# Patient Record
Sex: Female | Born: 1995 | Race: Black or African American | Hispanic: No | Marital: Single | Smoking: Never smoker
Health system: Southern US, Community
[De-identification: ages and names within clinical notes are randomized; demographics above are authoritative.]

## PROBLEM LIST (undated history)

## (undated) ENCOUNTER — Inpatient Hospital Stay: Payer: Self-pay

## (undated) DIAGNOSIS — Z789 Other specified health status: Secondary | ICD-10-CM

## (undated) HISTORY — PX: OTHER SURGICAL HISTORY: SHX169

## (undated) HISTORY — DX: Other specified health status: Z78.9

---
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
## 2017-02-18 ENCOUNTER — Ambulatory Visit (INDEPENDENT_AMBULATORY_CARE_PROVIDER_SITE_OTHER): Payer: 59 | Admitting: Family Medicine

## 2017-02-18 DIAGNOSIS — Z3042 Encounter for surveillance of injectable contraceptive: Secondary | ICD-10-CM

## 2017-02-18 MED ORDER — MEDROXYPROGESTERONE ACETATE 150 MG/ML IM SUSP
150.0000 mg | Freq: Once | INTRAMUSCULAR | Status: AC
  Administered 2017-02-18: 150 mg via INTRAMUSCULAR

## 2017-02-18 NOTE — Progress Notes (Signed)
Here for depo-provera injection per CMA.

## 2017-02-18 NOTE — Consult Note (Signed)
  Patient came into office today for Depo Provera injection she states that she feels well today and has no other complaints or concerns to address. Patients last Depo was administered on 11/19/16 she was instructed to return back to clinic on or between the dates of 12/7-12/21/18. Patient tolerated injection well today and showed no signs of adverse reaction, she was instructed to return back to the clinic on or between the dates of 3/8-3/22/19.

## 2017-05-06 ENCOUNTER — Ambulatory Visit: Payer: Self-pay | Admitting: Physician Assistant

## 2017-05-06 ENCOUNTER — Ambulatory Visit (INDEPENDENT_AMBULATORY_CARE_PROVIDER_SITE_OTHER): Payer: 59 | Admitting: Family Medicine

## 2017-05-06 DIAGNOSIS — Z3042 Encounter for surveillance of injectable contraceptive: Secondary | ICD-10-CM | POA: Diagnosis not present

## 2017-05-06 MED ORDER — MEDROXYPROGESTERONE ACETATE 150 MG/ML IM SUSP
150.0000 mg | Freq: Once | INTRAMUSCULAR | Status: AC
  Administered 2017-05-06: 150 mg via INTRAMUSCULAR

## 2017-05-06 NOTE — Progress Notes (Signed)
Patient comes in office today for Depo injection. Patient had her last  depo provera injection on 02/18/2017 and was instructed to return back between the dates of 05/06/17-05/20/17. Patient tolerated injection well today with no adverse reactions. Patient states she has been receiving injections the past 2 years. She reports that she will call back before May 24 th 2019 to schedule a appointment to discuss alternate contraceptive.

## 2020-06-13 DIAGNOSIS — O365990 Maternal care for other known or suspected poor fetal growth, unspecified trimester, not applicable or unspecified: Secondary | ICD-10-CM

## 2020-10-06 ENCOUNTER — Ambulatory Visit: Payer: 59

## 2022-10-17 ENCOUNTER — Emergency Department
Admission: EM | Admit: 2022-10-17 | Discharge: 2022-10-17 | Disposition: A | Discharge status: Home / Self Care | Payer: Managed Care, Other (non HMO) | Attending: Emergency Medicine | Admitting: Emergency Medicine

## 2022-10-17 ENCOUNTER — Encounter: Payer: Self-pay | Admitting: *Deleted

## 2022-10-17 ENCOUNTER — Other Ambulatory Visit: Payer: Self-pay

## 2022-10-17 DIAGNOSIS — K0889 Other specified disorders of teeth and supporting structures: Secondary | ICD-10-CM | POA: Diagnosis present

## 2022-10-17 DIAGNOSIS — K029 Dental caries, unspecified: Secondary | ICD-10-CM | POA: Diagnosis not present

## 2022-10-17 MED ORDER — ONDANSETRON 4 MG PO TBDP
4.0000 mg | ORAL_TABLET | Freq: Once | ORAL | Status: AC
  Administered 2022-10-17: 4 mg via ORAL
  Filled 2022-10-17: qty 1

## 2022-10-17 MED ORDER — BUPIVACAINE HCL (PF) 0.5 % IJ SOLN
50.0000 mL | Freq: Once | INTRAMUSCULAR | Status: AC
  Administered 2022-10-17: 10 mL
  Filled 2022-10-17 (×2): qty 60

## 2022-10-17 MED ORDER — AMOXICILLIN-POT CLAVULANATE 875-125 MG PO TABS
1.0000 | ORAL_TABLET | Freq: Once | ORAL | Status: AC
  Administered 2022-10-17: 1 via ORAL
  Filled 2022-10-17: qty 1

## 2022-10-17 MED ORDER — KETOROLAC TROMETHAMINE 30 MG/ML IJ SOLN
30.0000 mg | Freq: Once | INTRAMUSCULAR | Status: AC
  Administered 2022-10-17: 30 mg via INTRAMUSCULAR
  Filled 2022-10-17: qty 1

## 2022-10-17 MED ORDER — ACETAMINOPHEN 500 MG PO TABS
1000.0000 mg | ORAL_TABLET | Freq: Once | ORAL | Status: AC
  Administered 2022-10-17: 1000 mg via ORAL
  Filled 2022-10-17: qty 2

## 2022-10-17 MED ORDER — BUPIVACAINE HCL 0.5 % IJ SOLN: 50.0000 mL | Freq: Once | INTRAMUSCULAR | Status: DC

## 2022-10-17 MED ORDER — ONDANSETRON HCL 4 MG PO TABS: 4.0000 mg | ORAL_TABLET | Freq: Every day | ORAL | 1 refills | Status: DC | PRN

## 2022-10-17 MED ORDER — AMOXICILLIN-POT CLAVULANATE 875-125 MG PO TABS: 1.0000 | ORAL_TABLET | Freq: Two times a day (BID) | ORAL | 0 refills | Status: AC

## 2022-10-17 MED ORDER — OXYCODONE HCL 5 MG PO TABS: 5.0000 mg | ORAL_TABLET | Freq: Three times a day (TID) | ORAL | 0 refills | Status: DC | PRN

## 2022-10-17 MED ORDER — OXYCODONE HCL 5 MG PO TABS
5.0000 mg | ORAL_TABLET | Freq: Once | ORAL | Status: AC
  Administered 2022-10-17: 5 mg via ORAL
  Filled 2022-10-17: qty 1

## 2022-10-17 NOTE — ED Notes (Addendum)
Patient c/o left lower jaw/tooth pain. Appears to have breakage in tooth near the back of the mouth. Swelling noted to left jaw

## 2022-10-17 NOTE — ED Triage Notes (Signed)
Pt reports right sided dental pain, onset tonight, she took 2 amoxicillin and ibuprofen without relief.

## 2022-10-17 NOTE — ED Provider Notes (Signed)
[facility] Provider Note    Event Date/Time   First MD Initiated Contact with Patient 10/17/22 [phone]     (approximate)   History   Dental Pain   HPI  Jasmine Perkins is a 27 y.o. female   Past medical history of significant past medical history presents emergency department with dental pain.  She has had ongoing right lower molar pain for the past year and was asked to see a dentist but has never connected with 1 due to insurance problems.  Pain is intermittent chronic but worsened over the last several days.  Involves her right side lower jaw.  She has had no significant swelling.  No problems with swallowing or breathing.  No fever or chills.  Independent Historian contributed to assessment above: Her boyfriend is at bedside corroborates information past medical history as above    Physical Exam   Triage Vital Signs: ED Triage Vitals  Encounter Vitals Group     BP 10/17/22 0105 (!) 147/106     Systolic BP Percentile --      Diastolic BP Percentile --      Pulse Rate 10/17/22 0105 67     Resp 10/17/22 0105 20     Temp 10/17/22 0105 98.5 F (36.9 C)     Temp Source 10/17/22 0105 Axillary     SpO2 10/17/22 0105 100 %     Weight 10/17/22 0105 180 lb (81.6 kg)     Height 10/17/22 0105 5\' 7"  (1.702 m)     Head Circumference --      Peak Flow --      Pain Score 10/17/22 0107 10     Pain Loc --      Pain Education --      Exclude from Growth Chart --     Most recent vital signs: Vitals:   10/17/22 0105 10/17/22 0523  BP: (!) 147/106 133/77  Pulse: 67 70  Resp: 20 18  Temp: 98.5 F (36.9 C) 98 F (36.7 C)  SpO2: 100% 100%    General: Awake, no distress.  CV:  Good peripheral perfusion.  Resp:  Normal effort.  Abd:  No distention.  Other:  Phonation is normal.  Nontoxic-appearing afebrile.  No facial or neck swelling.  There appears to be dental caries to the affected right lower molar.  No obvious abscess.   ED Results / Procedures /  Treatments   Labs (all labs ordered are listed, but only abnormal results are displayed) Labs Reviewed - No data to display  PROCEDURES:  Critical Care performed: No  Dental Block  Date/Time: 10/17/2022 6:02 AM  Performed by: Pilar Jarvis, MD Authorized by: Pilar Jarvis, MD   Consent:    Consent obtained:  Verbal   Consent given by:  Patient   Risks discussed:  Allergic reaction, infection, pain, intravascular injection and nerve damage   Alternatives discussed:  No treatment and delayed treatment Universal protocol:    Procedure explained and questions answered to patient or proxy's satisfaction: yes     Patient identity confirmed:  Verbally with patient Indications:    Indications: dental pain   Location:    Block type:  Inferior alveolar   Laterality:  Right Procedure details:    Syringe type:  Controlled syringe   Needle gauge:  25 G   Anesthetic injected:  Bupivacaine 0.5% w/o epi   Injection procedure:  Anatomic landmarks identified, anatomic landmarks palpated, introduced needle, negative aspiration for blood and incremental  injection Post-procedure details:    Outcome:  Anesthesia achieved   Procedure completion:  Tolerated    MEDICATIONS ORDERED IN ED: Medications  ketorolac (TORADOL) 30 MG/ML injection 30 mg (30 mg Intramuscular Given 10/17/22 0442)  acetaminophen (TYLENOL) tablet 1,000 mg (1,000 mg Oral Given 10/17/22 0442)  amoxicillin-clavulanate (AUGMENTIN) 875-125 MG per tablet 1 tablet (1 tablet Oral Given 10/17/22 0441)  ondansetron (ZOFRAN-ODT) disintegrating tablet 4 mg (4 mg Oral Given 10/17/22 0442)  oxyCODONE (Oxy IR/ROXICODONE) immediate release tablet 5 mg (5 mg Oral Given 10/17/22 0442)  bupivacaine(PF) (MARCAINE) 0.5 % injection 50 mL (10 mLs Infiltration Given 10/17/22 0454)     IMPRESSION / MDM / ASSESSMENT AND PLAN / ED COURSE  I reviewed the triage vital signs and the nursing notes.                                Patient's presentation is  most consistent with acute presentation with potential threat to life or bodily function.  Differential diagnosis includes, but is not limited to, dental pain, dental caries, dental infection   The patient is on the cardiac monitor to evaluate for evidence of arrhythmia and/or significant heart rate changes.  MDM:    Patient with dental pain, dental caries, nontoxic doubt sepsis no drainable abscess noted.  Needs to follow-up with dentist, I gave her the contact information for our [facility] dental contact.  Pain treatment in the emergency department with dental block, oral pain medications, anti-inflammatory, and antibiotic.       FINAL CLINICAL IMPRESSION(S) / ED DIAGNOSES   Final diagnoses:  Pain, dental     Rx / DC Orders   ED Discharge Orders          Ordered    amoxicillin-clavulanate (AUGMENTIN) 875-125 MG tablet  2 times daily        10/17/22 0436    oxyCODONE (ROXICODONE) 5 MG immediate release tablet  Every 8 hours PRN        10/17/22 0436    ondansetron (ZOFRAN) 4 MG tablet  Daily PRN        10/17/22 0438             Note:  This document was prepared using Dragon voice recognition software and may include unintentional dictation errors.    Pilar Jarvis, MD 10/17/22 [phone]

## 2022-10-17 NOTE — Discharge Instructions (Addendum)
Take acetaminophen 650 mg and ibuprofen 400 mg every 6 hours for pain.  Take with food. Take oxycodone as prescribed for severe/breakthrough pain as we discussed.  Take Zofran as needed for nausea. Take augmentin antibiotic for 10 days.   Call Dr. Lucky Cowboy for an appointment to address your tooth pain, or find any other dentist as soon as possible.  Thank you for choosing [place] for your health care today!  Please see your primary doctor this week for a follow up appointment.   If you have any new, worsening, or unexpected symptoms call your doctor right away or come back to the emergency department for reevaluation.  It was my pleasure to care for you today.   Daneil Dan Modesto Charon, MD

## 2023-03-02 NOTE — L&D Delivery Note (Signed)
 Vaginal delivery note : Pt in OR for urgent c/s for Fetal intolerance to labor . Prepping the patient and RN taking off FSE stated pt was complete and complete+2 station   Pt then placed in Mcroberts position and after 3 sets of pushes a Kiwi vacuum was placed and second set of pushes she delivered a head . Loose nuchal cord was reduced and shoulders and body delivered without difficulty . Female  cord clamped and cut and nursery staff assigned apgars 8/9 . Weight #4/8 Placenta delivered 5 minutes after  IV pitocin administered . Good hemostasis. No vaginal lacerations . EBL 100 cc. No complications

## 2023-04-12 DIAGNOSIS — O0993 Supervision of high risk pregnancy, unspecified, third trimester: Secondary | ICD-10-CM | POA: Insufficient documentation

## 2023-04-12 DIAGNOSIS — O0992 Supervision of high risk pregnancy, unspecified, second trimester: Secondary | ICD-10-CM | POA: Insufficient documentation

## 2023-04-13 DIAGNOSIS — O3503X0 Maternal care for (suspected) central nervous system malformation or damage in fetus, choroid plexus cysts, not applicable or unspecified: Secondary | ICD-10-CM | POA: Insufficient documentation

## 2023-04-13 LAB — OB RESULTS CONSOLE VARICELLA ZOSTER ANTIBODY, IGG: Varicella: IMMUNE

## 2023-04-13 LAB — OB RESULTS CONSOLE RUBELLA ANTIBODY, IGM: Rubella: IMMUNE

## 2023-04-13 LAB — OB RESULTS CONSOLE HEPATITIS B SURFACE ANTIGEN: Hepatitis B Surface Ag: NEGATIVE

## 2023-05-17 DIAGNOSIS — O365990 Maternal care for other known or suspected poor fetal growth, unspecified trimester, not applicable or unspecified: Secondary | ICD-10-CM | POA: Insufficient documentation

## 2023-06-07 ENCOUNTER — Telehealth: Payer: Self-pay

## 2023-06-07 ENCOUNTER — Other Ambulatory Visit: Payer: Self-pay | Admitting: Obstetrics and Gynecology

## 2023-06-07 DIAGNOSIS — Z36 Encounter for antenatal screening for chromosomal anomalies: Secondary | ICD-10-CM

## 2023-06-07 NOTE — Telephone Encounter (Signed)
REFERRAL

## 2023-06-08 ENCOUNTER — Telehealth: Payer: Self-pay

## 2023-06-14 ENCOUNTER — Emergency Department

## 2023-06-14 ENCOUNTER — Inpatient Hospital Stay: Admission: EM | Admit: 2023-06-14 | Discharge: 2023-06-17 | DRG: 832

## 2023-06-14 DIAGNOSIS — O99891 Other specified diseases and conditions complicating pregnancy: Principal | ICD-10-CM | POA: Diagnosis present

## 2023-06-14 DIAGNOSIS — R6884 Jaw pain: Secondary | ICD-10-CM | POA: Diagnosis not present

## 2023-06-14 DIAGNOSIS — K122 Cellulitis and abscess of mouth: Principal | ICD-10-CM

## 2023-06-14 DIAGNOSIS — Z3A27 27 weeks gestation of pregnancy: Secondary | ICD-10-CM

## 2023-06-14 DIAGNOSIS — O365920 Maternal care for other known or suspected poor fetal growth, second trimester, not applicable or unspecified: Secondary | ICD-10-CM | POA: Diagnosis present

## 2023-06-14 DIAGNOSIS — Z8249 Family history of ischemic heart disease and other diseases of the circulatory system: Secondary | ICD-10-CM

## 2023-06-14 LAB — CBC WITH DIFFERENTIAL/PLATELET
Abs Immature Granulocytes: 0.05 10*3/uL (ref 0.00–0.07)
Basophils Absolute: 0 10*3/uL (ref 0.0–0.1)
Basophils Relative: 0 %
Eosinophils Absolute: 0 10*3/uL (ref 0.0–0.5)
Eosinophils Relative: 0 %
HCT: 37.5 % (ref 36.0–46.0)
Hemoglobin: 12.6 g/dL (ref 12.0–15.0)
Immature Granulocytes: 0 %
Lymphocytes Relative: 10 %
Lymphs Abs: 1.2 10*3/uL (ref 0.7–4.0)
MCH: 29 pg (ref 26.0–34.0)
MCHC: 33.6 g/dL (ref 30.0–36.0)
MCV: 86.4 fL (ref 80.0–100.0)
Monocytes Absolute: 0.9 10*3/uL (ref 0.1–1.0)
Monocytes Relative: 7 %
Neutro Abs: 9.8 10*3/uL — ABNORMAL HIGH (ref 1.7–7.7)
Neutrophils Relative %: 83 %
Platelets: 249 10*3/uL (ref 150–400)
RBC: 4.34 MIL/uL (ref 3.87–5.11)
RDW: 12.9 % (ref 11.5–15.5)
WBC: 12 10*3/uL — ABNORMAL HIGH (ref 4.0–10.5)
nRBC: 0 % (ref 0.0–0.2)

## 2023-06-14 LAB — BASIC METABOLIC PANEL WITH GFR
Anion gap: 10 (ref 5–15)
BUN: 6 mg/dL (ref 6–20)
CO2: 22 mmol/L (ref 22–32)
Calcium: 8.8 mg/dL — ABNORMAL LOW (ref 8.9–10.3)
Chloride: 102 mmol/L (ref 98–111)
Creatinine, Ser: 0.71 mg/dL (ref 0.44–1.00)
GFR, Estimated: >60 mL/min (ref >60)
Glucose, Bld: 97 mg/dL (ref 70–99)
Potassium: 3.8 mmol/L (ref 3.5–5.1)
Sodium: 134 mmol/L — ABNORMAL LOW (ref 135–145)

## 2023-06-14 LAB — GROUP A STREP BY PCR: Group A Strep by PCR: NOT DETECTED

## 2023-06-14 MED ORDER — SODIUM CHLORIDE 0.9 % IV SOLN
3.0000 g | Freq: Once | INTRAVENOUS | Status: AC
  Administered 2023-06-14: 3 g via INTRAVENOUS
  Filled 2023-06-14: qty 8

## 2023-06-14 MED ORDER — DEXAMETHASONE SODIUM PHOSPHATE 10 MG/ML IJ SOLN
10.0000 mg | Freq: Once | INTRAMUSCULAR | Status: AC
  Administered 2023-06-14: 10 mg via INTRAVENOUS
  Filled 2023-06-14: qty 1

## 2023-06-14 MED ORDER — IOHEXOL 300 MG/ML  SOLN
75.0000 mL | Freq: Once | INTRAMUSCULAR | Status: AC | PRN
  Administered 2023-06-14: 75 mL via INTRAVENOUS

## 2023-06-14 NOTE — ED Provider Notes (Signed)
 [facility] Emergency Department Provider Note     Event Date/Time   First MD Initiated Contact with Patient 06/14/23 1840     (approximate)   History   Dental Pain and Sore Throat   HPI  Jasmine Perkins is a 28 y.o. female G1, P0 at 7 months gestation with a single IUP, presents to the ED endorsing pain to the right lower jaw that she believes is due to a wisdom tooth cavity.  She is also endorsing some trismus that she describes difficulty fully opening the jaw.  She would also endorse some difficulty swallowing her secretions with onset yesterday.  Patient denies any frank fevers but is endorsing some chills.  She reports difficulty swallowing liquids and solid food at this time but has had poor p.o. intake since yesterday.  She gives a remote history of strep pharyngitis as an adult.  She denies any pregnancy-related complaints at this time.   Physical Exam   Triage Vital Signs: ED Triage Vitals  Encounter Vitals Group     BP 06/14/23 1747 (!) 134/102     Systolic BP Percentile --      Diastolic BP Percentile --      Pulse Rate 06/14/23 1747 99     Resp 06/14/23 1747 18     Temp 06/14/23 1747 99 F (37.2 C)     Temp src --      SpO2 06/14/23 1747 99 %     Weight 06/14/23 1743 180 lb (81.6 kg)     Height 06/14/23 1743 5\' 7"  (1.702 m)     Head Circumference --      Peak Flow --      Pain Score 06/14/23 1743 10     Pain Loc --      Pain Education --      Exclude from Growth Chart --     Most recent vital signs: Vitals:   06/14/23 2230 06/14/23 2300  BP: 117/73 121/87  Pulse: 85 99  Resp:  14  Temp:    SpO2: 99% 100%    General Awake, no distress. NAD HEENT NCAT. PERRL. EOMI. No rhinorrhea. Mucous membranes are moist.  Uvula is midline but the right peritonsillar space appears full and erythematous.  The right lower jaw shows some mild pericoronitis around the right lower molar.  No focal gum swelling, pointing, or fluctuance is  appreciated.  No sublingual edema noted.  Fullness to the submandibular region slightly to the right of midline noted on exam. CV:  Good peripheral perfusion. RRR RESP:  Normal effort. CTA ABD:  No distention. gravid   ED Results / Procedures / Treatments   Labs (all labs ordered are listed, but only abnormal results are displayed) Labs Reviewed  BASIC METABOLIC PANEL WITH GFR - Abnormal; Notable for the following components:      Result Value   Sodium 134 (*)    Calcium 8.8 (*)    All other components within normal limits  CBC WITH DIFFERENTIAL/PLATELET - Abnormal; Notable for the following components:   WBC 12.0 (*)    Neutro Abs 9.8 (*)    All other components within normal limits  GROUP A STREP BY PCR    EKG   RADIOLOGY  I personally viewed and evaluated these images as part of my medical decision making, as well as reviewing the written report by the radiologist.  ED Provider Interpretation: Submandibular space infection with a edema, cellulitis, and phlegmon.  No  discrete drainable abscess noted  CT Soft Tissue Neck W Contrast Result Date: 06/14/2023 CLINICAL DATA:  Epiglottitis or tonsillitis suspected Peritonsilar abscess + submandibular space fullness EXAM: CT NECK WITH CONTRAST TECHNIQUE: Multidetector CT imaging of the neck was performed using the standard protocol following the bolus administration of intravenous contrast. RADIATION DOSE REDUCTION: This exam was performed according to the departmental dose-optimization program which includes automated exposure control, adjustment of the mA and/or kV according to patient size and/or use of iterative reconstruction technique. CONTRAST:  75mL OMNIPAQUE IOHEXOL 300 MG/ML  SOLN COMPARISON:  None Available. FINDINGS: Pharynx/larynx and surrounding soft tissues: Edema involving the right submandibular space with extension to involve the right floor mouth and right masticator space. No discrete, drainable fluid collection  Salivary glands: Edema surrounding the right submandibular gland but the gland itself appears within normal limits. Normal appearance of the parotid glands. No calculi. No ductal dilation Thyroid: Subcentimeter thyroid nodules do not require further imaging follow-up (ref: J Am Coll Radiol. 2015 Feb;12(2): 143-50). Lymph nodes: Mildly prominent right submandibular lymph nodes. Area of submental nodularity seen on remote CT neck is likely still present and similar. Vascular: Not well evaluated due to non arterial timing. Limited intracranial: Negative. Visualized orbits: Negative. Mastoids and visualized paranasal sinuses: Clear. Skeleton: No acute or aggressive process. Upper chest: Visualized lung apices are clear. IMPRESSION: 1. Edema involving the right submandibular space with extension to involve the right floor mouth and right masticator space, most likely infectious cellulitis/phlegmon. No discrete, drainable fluid collection. 2. Mildly irregular soft tissue nodularity in the submental space is likely similar in comparison to remote 2015 prior. Electronically Signed   By: Stevenson Elbe M.D.   On: 06/14/2023 22:45     PROCEDURES:  Critical Care performed: No  Procedures   MEDICATIONS ORDERED IN ED: Medications  Ampicillin-Sulbactam (UNASYN) 3 g in sodium chloride 0.9 % 100 mL IVPB (0 g Intravenous Stopped 06/14/23 2308)  dexamethasone (DECADRON) injection 10 mg (10 mg Intravenous Given 06/14/23 2055)  iohexol (OMNIPAQUE) 300 MG/ML solution 75 mL (75 mLs Intravenous Contrast Given 06/14/23 2127)     IMPRESSION / MDM / ASSESSMENT AND PLAN / ED COURSE  I reviewed the triage vital signs and the nursing notes.                              Differential diagnosis includes, but is not limited to, dental abscess, dental caries, pericoronitis, Ludwig's angina, peritonsillar abscess, strep pharyngitis  Patient's presentation is most consistent with acute presentation with potential threat to  life or bodily function.  ----------------------------------------- 8:42 PM on 06/14/2023 ----------------------------------------- S/W Dr. Silvestre Drum (ENT): He recommends at least IV Unasyn and Decadron, and consult to OB to see if is any concern with the patient proceeding with a CT scan.  ----------------------------------------- 8:46 PM on 06/14/2023 ----------------------------------------- S/W Fraser Jackson, CNM: Discussed the case including recommendations from ENT.  No concern for the use of Decadron in the third trimester for this patient.  We discussed risk/benefit of CT imaging to determine the severity of the patient's increasing soft tissue neck infection.  She agrees that imaging is appropriate in this situation, and will await imaging results and prognosis.  Female patient at 96 months gestation with an acute soft tissue infection to the throat with clinical picture concerning for an expanding PTA and or submandibular infection.  I discussed the risk-benefit with further imaging versus medical management with the patient.  She understands  the potential for worsening of her condition as well as need for bedside intervention.  We discussed the radiation exposure with CT imaging.  Patient was agreeable to move forward with CT imaging as discussed.  ----------------------------------------- 11:23 PM on 06/14/2023 ----------------------------------------- CT results reviewed with the patient and her husband at bedside.  No drainable abscesses appreciated with the submandibular space infection.  Recommendation is that she be admitted to the hospital for continued IV antibiotics and reevaluation tomorrow.  Patient is understanding of the diagnosis, prognosis, and recommendation.  She agrees to admission at this time.  Dr. Vallarie Gauze returned the page regarding hospital admission.  She advises that the [facility] team will admit and she is available for consult for medical management.  Patient's diagnosis is  consistent with an acute submandibular space infection with no appreciable drainable abscess. Patient will be admitted to the [facility] service for ongoing IV antibiotic admission.  FINAL CLINICAL IMPRESSION(S) / ED DIAGNOSES   Final diagnoses:  Submandibular space infection  Pregnancy with 27 completed weeks gestation     Rx / DC Orders   ED Discharge Orders     None        Note:  This document was prepared using Dragon voice recognition software and may include unintentional dictation errors.    May Sparks, PA-C 06/14/23 2327    Shane Darling, MD 06/15/23 [phone]

## 2023-06-14 NOTE — H&P (Incomplete)
 HISTORY AND PHYSICAL NOTE  History of Present Illness: Jasmine Perkins is a 28 y.o. G1P0 at [redacted]w[redacted]d admitted for submandibular space infection.  She initially presented to the ED tonight with complaints of right lower jaw pain that she thought was due to wisdom tooth cavity.  She reported difficulty fully opening her jaw and difficulty with swallowing.  She denies fevers but does report chills. Reports difficulty with swallowing liquids and solid food and overall poor PO intake since yesterday. Denies any pregnancy related concerns. Her current pregnancy is complicated by fetal growth restriction that is currently followed by Maternal Fetal Medicine.   Reports active fetal movement  Contractions: denies  LOF/SROM: denies  Vaginal bleeding: denies    Prenatal care site:  [facility]  Patient Active Problem List   Diagnosis Date Noted  . 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 No pertinent past medical history.  Past Surgical History:  Procedure Laterality Date  . submental lymph node biopsy N/A     OB History  Gravida Para Term Preterm AB Living  1       SAB IAB Ectopic Multiple Live Births          # Outcome Date GA Lbr Len/2nd Weight Sex Type Anes PTL Lv  1 Current             Social History:  reports that she has never smoked. She has never used smokeless tobacco. She reports that she does not drink alcohol and does not use drugs.  Family History: family history includes Breast cancer in her maternal grandmother; Healthy in her brother, mother, sister, and sister; Heart attack in her father; Hypertension in her father.  No Known Allergies  (Not in a hospital admission)   Review of Systems  Constitutional:  Positive for chills. Negative for fever.  Respiratory:  Negative for cough and shortness of breath.   Cardiovascular:  Negative for chest pain.  Gastrointestinal:  Negative for abdominal pain, heartburn,  nausea and vomiting.  Genitourinary:  Negative for dysuria, frequency and urgency.  Musculoskeletal:  Negative for back pain.  Neurological:  Negative for dizziness and headaches.  Psychiatric/Behavioral:  Negative for depression. The patient is not nervous/anxious.     Physical Examination: Vitals:  BP 121/87   Pulse 99   Temp 99 F (37.2 C)   Resp 14   Ht 5\' 7"  (1.702 m)   Wt 81.6 kg   LMP 12/06/2022   SpO2 100%   BMI 28.19 kg/m  General: no acute distress.  HEENT: normocephalic, atraumatic *** Lungs: normal respiratory effort Abdomen: gravid  Pelvic: deferred  Extremities: non-tender, symmetric, *** edema bilaterally.  DTRs: ***  Neurologic: Alert & oriented x 3.    Membranes: intact  Labs:  Results for orders placed or performed during the hospital encounter of 06/14/23 (from the past 24 hours)  Group A Strep by PCR ([facility] Only)   Collection Time: 06/14/23  5:48 PM   Specimen: Throat; Sterile Swab  Result Value Ref Range   Group A Strep by PCR NOT DETECTED NOT DETECTED  Basic metabolic panel   Collection Time: 06/14/23  7:38 PM  Result Value Ref Range   Sodium 134 (L) 135 - 145 mmol/L   Potassium 3.8 3.5 - 5.1 mmol/L   Chloride 102 98 - 111 mmol/L   CO2 22 22 - 32 mmol/L   Glucose, Bld 97 70 - 99 mg/dL   BUN 6 6 - 20  mg/dL   Creatinine, Ser 1.61 0.44 - 1.00 mg/dL   Calcium 8.8 (L) 8.9 - 10.3 mg/dL   GFR, Estimated >09 >60 mL/min   Anion gap 10 5 - 15  CBC with Differential   Collection Time: 06/14/23  7:38 PM  Result Value Ref Range   WBC 12.0 (H) 4.0 - 10.5 K/uL   RBC 4.34 3.87 - 5.11 MIL/uL   Hemoglobin 12.6 12.0 - 15.0 g/dL   HCT 45.4 09.8 - 11.9 %   MCV 86.4 80.0 - 100.0 fL   MCH 29.0 26.0 - 34.0 pg   MCHC 33.6 30.0 - 36.0 g/dL   RDW 14.7 82.9 - 56.2 %   Platelets 249 150 - 400 K/uL   nRBC 0.0 0.0 - 0.2 %   Neutrophils Relative % 83 %   Neutro Abs 9.8 (H) 1.7 - 7.7 K/uL   Lymphocytes Relative 10 %   Lymphs Abs 1.2 0.7 - 4.0 K/uL   Monocytes  Relative 7 %   Monocytes Absolute 0.9 0.1 - 1.0 K/uL   Eosinophils Relative 0 %   Eosinophils Absolute 0.0 0.0 - 0.5 K/uL   Basophils Relative 0 %   Basophils Absolute 0.0 0.0 - 0.1 K/uL   Immature Granulocytes 0 %   Abs Immature Granulocytes 0.05 0.00 - 0.07 K/uL    Imaging Studies: CT Soft Tissue Neck W Contrast Result Date: 06/14/2023 CLINICAL DATA:  Epiglottitis or tonsillitis suspected Peritonsilar abscess + submandibular space fullness EXAM: CT NECK WITH CONTRAST TECHNIQUE: Multidetector CT imaging of the neck was performed using the standard protocol following the bolus administration of intravenous contrast. RADIATION DOSE REDUCTION: This exam was performed according to the departmental dose-optimization program which includes automated exposure control, adjustment of the mA and/or kV according to patient size and/or use of iterative reconstruction technique. CONTRAST:  75mL OMNIPAQUE IOHEXOL 300 MG/ML  SOLN COMPARISON:  None Available. FINDINGS: Pharynx/larynx and surrounding soft tissues: Edema involving the right submandibular space with extension to involve the right floor mouth and right masticator space. No discrete, drainable fluid collection Salivary glands: Edema surrounding the right submandibular gland but the gland itself appears within normal limits. Normal appearance of the parotid glands. No calculi. No ductal dilation Thyroid: Subcentimeter thyroid nodules do not require further imaging follow-up (ref: J Am Coll Radiol. 2015 Feb;12(2): 143-50). Lymph nodes: Mildly prominent right submandibular lymph nodes. Area of submental nodularity seen on remote CT neck is likely still present and similar. Vascular: Not well evaluated due to non arterial timing. Limited intracranial: Negative. Visualized orbits: Negative. Mastoids and visualized paranasal sinuses: Clear. Skeleton: No acute or aggressive process. Upper chest: Visualized lung apices are clear. IMPRESSION: 1. Edema involving the  right submandibular space with extension to involve the right floor mouth and right masticator space, most likely infectious cellulitis/phlegmon. No discrete, drainable fluid collection. 2. Mildly irregular soft tissue nodularity in the submental space is likely similar in comparison to remote 2015 prior. Electronically Signed   By: Feliberto Harts M.D.   On: 06/14/2023 22:45    Assessment and Plan: Patient Active Problem List   Diagnosis Date Noted  . 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 Admission for submandibular space infection  - Admission status: Observation - Dr Beverly Gust MD notified of admission and plan of care  - Will admit overnight for IV antibiotics furt - ENT previously consulted for evaluation  [facility] team consult for medical management  - Clear liquid diet  -  Activity as tolerated  - VS per routine   2. Routine antenatal - No pregnancy complaints currently  - FHT with doppler q shift   3. Submandibular space infection  - CT imaging completed - no discrete, drainable fluid collection  - IV Unasyn and decodron given  -   Teodora Fell, CNM  Certified Nurse Midwife [place]  [facility]

## 2023-06-14 NOTE — ED Notes (Signed)
 Pt noted to have swelling on lower right side of mouth, pt stating pain goes through jaw to the back of her neck. Pt also stating it is hard to speak and swallow due to the pain.

## 2023-06-14 NOTE — ED Triage Notes (Signed)
 Pt presents to the ED POV from home for right sided lower dental pain that started yesterday. Pt reports that today her throat started hurting. Pt states that she is also 7 months pregnant. Pt states that she has a cavity between her wisdom tooth and the tooth in front of it. Pt denies fevers or chills.

## 2023-06-14 NOTE — ED Notes (Signed)
 Patient transported to CT

## 2023-06-14 NOTE — H&P (Signed)
 HISTORY AND PHYSICAL NOTE  History of Present Illness: Jasmine Perkins is a 28 y.o. G1P0 at [redacted]w[redacted]d admitted for submandibular space infection.  She initially presented to the ED tonight with complaints of right lower jaw pain that she thought was due to wisdom tooth cavity.  She reported difficulty fully opening her jaw and difficulty with swallowing.  She denies fevers but does report chills. Reports difficulty with swallowing liquids and solid food and overall poor PO intake since yesterday. Denies any pregnancy related concerns. Her current pregnancy is complicated by fetal growth restriction that is currently followed by Maternal Fetal Medicine.   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 No pertinent past medical history.  Past Surgical History:  Procedure Laterality Date   submental lymph node biopsy N/A     OB History  Gravida Para Term Preterm AB Living  1       SAB IAB Ectopic Multiple Live Births          # Outcome Date GA Lbr Len/2nd Weight Sex Type Anes PTL Lv  1 Current             Social History:  reports that she has never smoked. She has never used smokeless tobacco. She reports that she does not drink alcohol and does not use drugs.  Family History: family history includes Breast cancer in her maternal grandmother; Healthy in her brother, mother, sister, and sister; Heart attack in her father; Hypertension in her father.  No Known Allergies  (Not in a hospital admission)   Review of Systems  Constitutional:  Positive for chills. Negative for fever.  Respiratory:  Negative for cough and shortness of breath.   Cardiovascular:  Negative for chest pain.  Gastrointestinal:  Negative for abdominal pain, heartburn,  nausea and vomiting.  Genitourinary:  Negative for dysuria, frequency and urgency.  Musculoskeletal:  Negative for back pain.  Neurological:  Negative for dizziness and headaches.  Psychiatric/Behavioral:  Negative for depression. The patient is not nervous/anxious.     Physical Examination: Vitals:  BP 121/87   Pulse 99   Temp 99 F (37.2 C)   Resp 14   Ht 5\' 7"  (1.702 m)   Wt 81.6 kg   LMP 12/06/2022   SpO2 100%   BMI 28.19 kg/m  General: no acute distress.  Abdomen: gravid  Pelvic: deferred  Neurologic: Alert & oriented x 3.    Membranes: intact  Labs:  Results for orders placed or performed during the hospital encounter of 06/14/23 (from the past 24 hours)  Group A Strep by PCR ([facility] Only)   Collection Time: 06/14/23  5:48 PM   Specimen: Throat; Sterile Swab  Result Value Ref Range   Group A Strep by PCR NOT DETECTED NOT DETECTED  Basic metabolic panel   Collection Time: 06/14/23  7:38 PM  Result Value Ref Range   Sodium 134 (L) 135 - 145 mmol/L   Potassium 3.8 3.5 - 5.1 mmol/L   Chloride 102 98 - 111 mmol/L   CO2 22 22 - 32 mmol/L   Glucose, Bld 97 70 - 99 mg/dL   BUN 6 6 - 20 mg/dL   Creatinine, Ser 1.61 0.44 - 1.00 mg/dL   Calcium 8.8 (L) 8.9 - 10.3  mg/dL   GFR, Estimated >16 >10 mL/min   Anion gap 10 5 - 15  CBC with Differential   Collection Time: 06/14/23  7:38 PM  Result Value Ref Range   WBC 12.0 (H) 4.0 - 10.5 K/uL   RBC 4.34 3.87 - 5.11 MIL/uL   Hemoglobin 12.6 12.0 - 15.0 g/dL   HCT 96.0 45.4 - 09.8 %   MCV 86.4 80.0 - 100.0 fL   MCH 29.0 26.0 - 34.0 pg   MCHC 33.6 30.0 - 36.0 g/dL   RDW 11.9 14.7 - 82.9 %   Platelets 249 150 - 400 K/uL   nRBC 0.0 0.0 - 0.2 %   Neutrophils Relative % 83 %   Neutro Abs 9.8 (H) 1.7 - 7.7 K/uL   Lymphocytes Relative 10 %   Lymphs Abs 1.2 0.7 - 4.0 K/uL   Monocytes Relative 7 %   Monocytes Absolute 0.9 0.1 - 1.0 K/uL   Eosinophils Relative 0 %   Eosinophils Absolute 0.0 0.0 - 0.5 K/uL   Basophils Relative  0 %   Basophils Absolute 0.0 0.0 - 0.1 K/uL   Immature Granulocytes 0 %   Abs Immature Granulocytes 0.05 0.00 - 0.07 K/uL    Imaging Studies: CT Soft Tissue Neck W Contrast Result Date: 06/14/2023 CLINICAL DATA:  Epiglottitis or tonsillitis suspected Peritonsilar abscess + submandibular space fullness EXAM: CT NECK WITH CONTRAST TECHNIQUE: Multidetector CT imaging of the neck was performed using the standard protocol following the bolus administration of intravenous contrast. RADIATION DOSE REDUCTION: This exam was performed according to the departmental dose-optimization program which includes automated exposure control, adjustment of the mA and/or kV according to patient size and/or use of iterative reconstruction technique. CONTRAST:  75mL OMNIPAQUE IOHEXOL 300 MG/ML  SOLN COMPARISON:  None Available. FINDINGS: Pharynx/larynx and surrounding soft tissues: Edema involving the right submandibular space with extension to involve the right floor mouth and right masticator space. No discrete, drainable fluid collection Salivary glands: Edema surrounding the right submandibular gland but the gland itself appears within normal limits. Normal appearance of the parotid glands. No calculi. No ductal dilation Thyroid: Subcentimeter thyroid nodules do not require further imaging follow-up (ref: J Am Coll Radiol. 2015 Feb;12(2): 143-50). Lymph nodes: Mildly prominent right submandibular lymph nodes. Area of submental nodularity seen on remote CT neck is likely still present and similar. Vascular: Not well evaluated due to non arterial timing. Limited intracranial: Negative. Visualized orbits: Negative. Mastoids and visualized paranasal sinuses: Clear. Skeleton: No acute or aggressive process. Upper chest: Visualized lung apices are clear. IMPRESSION: 1. Edema involving the right submandibular space with extension to involve the right floor mouth and right masticator space, most likely infectious cellulitis/phlegmon.  No discrete, drainable fluid collection. 2. Mildly irregular soft tissue nodularity in the submental space is likely similar in comparison to remote 2015 prior. Electronically Signed   By: Feliberto Harts M.D.   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 Admission for submandibular space infection  - Admission status: Observation - Dr Beverly Gust MD notified of admission and plan of care  - Will admit overnight for IV antibiotics and continued evaluation of infection  - ENT previously consulted for evaluation  [facility] team consult for medical management  - Clear liquid diet > advance to soft diet as tolerated  - Activity as tolerated  -  VS per routine   2. Routine antenatal - No pregnancy complaints currently  - FHT with doppler q shift   3. Submandibular space infection  - CT imaging completed - no discrete, drainable fluid collection  - IV Unasyn and decodron given  - Will continue Unasyn 3 grams q 6 hours per recommendations  - ENT reviewed imaging. No indication for ENT follow up at this time.  - Pain management: IV medication until able to tolerate more PO intake  - Appreciate [facility] recommendations for management   Teodora Fell, CNM  Certified Nurse Midwife [place]  [facility]

## 2023-06-15 ENCOUNTER — Other Ambulatory Visit: Payer: Self-pay

## 2023-06-15 DIAGNOSIS — K122 Cellulitis and abscess of mouth: Secondary | ICD-10-CM | POA: Diagnosis not present

## 2023-06-15 DIAGNOSIS — Z3A27 27 weeks gestation of pregnancy: Secondary | ICD-10-CM

## 2023-06-15 LAB — CBC
HCT: 33.6 % — ABNORMAL LOW (ref 36.0–46.0)
Hemoglobin: 11.1 g/dL — ABNORMAL LOW (ref 12.0–15.0)
MCH: 28 pg (ref 26.0–34.0)
MCHC: 33 g/dL (ref 30.0–36.0)
MCV: 84.6 fL (ref 80.0–100.0)
Platelets: 229 10*3/uL (ref 150–400)
RBC: 3.97 MIL/uL (ref 3.87–5.11)
RDW: 12.9 % (ref 11.5–15.5)
WBC: 12.1 10*3/uL — ABNORMAL HIGH (ref 4.0–10.5)
nRBC: 0 % (ref 0.0–0.2)

## 2023-06-15 LAB — LACTIC ACID, PLASMA: Lactic Acid, Venous: 1.3 mmol/L (ref 0.5–1.9)

## 2023-06-15 MED ORDER — LACTATED RINGERS IV SOLN: INTRAVENOUS | Status: AC

## 2023-06-15 MED ORDER — MORPHINE SULFATE (PF) 2 MG/ML IV SOLN
2.0000 mg | INTRAVENOUS | Status: DC | PRN
  Administered 2023-06-15 (×2): 2 mg via INTRAVENOUS
  Filled 2023-06-15 (×2): qty 1

## 2023-06-15 MED ORDER — ACETAMINOPHEN 10 MG/ML IV SOLN
1000.0000 mg | Freq: Three times a day (TID) | INTRAVENOUS | Status: DC
  Administered 2023-06-15 (×2): 1000 mg via INTRAVENOUS
  Filled 2023-06-15 (×4): qty 100

## 2023-06-15 MED ORDER — SODIUM CHLORIDE 0.9 % IV SOLN
3.0000 g | Freq: Four times a day (QID) | INTRAVENOUS | Status: DC
  Administered 2023-06-15 – 2023-06-17 (×10): 3 g via INTRAVENOUS
  Filled 2023-06-15 (×10): qty 8

## 2023-06-15 MED ORDER — ACETAMINOPHEN 500 MG PO TABS
1000.0000 mg | ORAL_TABLET | Freq: Four times a day (QID) | ORAL | Status: DC
Start: 2023-06-15 — End: 2023-06-17
  Administered 2023-06-15 – 2023-06-17 (×7): 1000 mg via ORAL
  Filled 2023-06-15 (×7): qty 2

## 2023-06-15 NOTE — Assessment & Plan Note (Signed)
Management per OB. 

## 2023-06-15 NOTE — Hospital Course (Addendum)
 Hospital course / significant events:   HPI:  Jasmine Perkins is a 28 y.o. female with past medical history of At [redacted] weeks gestation being consulted on for submandibular space infection.  She presented to the ED with pain in the right lower jaw, with difficulty opening her mouth, swallowing and managing her secretions.   Of note, hx removal of submandibular lymph node / salivary gland years ago likely is impeding lymphatic drainage in this area   04/15: to ED. CT showed no organized fluid collection, ENT advised no ENT follow-up needed. Admitted to [facility] w/ hospitalist consult. 04/16: improving, swallowing okay, opens mouth some but hurt if goes too far, may advance diet as requested but avoid chewing hard foods. Advise remain on IV abx at least 48 h, will recheck tomorrow  04/17: WBC normalized, swelling improved but still present. Pt reports pain w/ opening jaw is about same as yesterday 04/18: significant improvement. OK to continue Unasyn  while inpatient, ok to switch to amoxicillin  875 mg po q 12 h, or Keflex 500 mg po q 6 h to complete total 7-10 days course (ending 04/22 or 04/25)   CT soft tissue neck showing the following: IMPRESSION: 1. Edema involving the right submandibular space with extension to involve the right floor mouth and right masticator space, most likely infectious cellulitis/phlegmon. No discrete, drainable fluid collection. 2. Mildly irregular soft tissue nodularity in the submental space is likely similar in comparison to remote 2015 prior.        ASSESSMENT & PLAN:   Submandibular space infection Of note, hx removal of submandibular lymph node / salivary gland years ago likely is impeding lymphatic drainage in this area  Continue Unasyn  at least 48h (which has been completed), ok to leave on this while inpatient ok to switch to amoxicillin  875 mg po q 12 h, or Keflex 500 mg po q 6 h to complete total 7-10 days course (ending 04/22 or 04/25) Will hold off  further Decadron , she has been improving without it  Reimage if worse No ENT need at this time, no operable mass/abscess  Pain control Soft diet as desired and advance as tolerated    Pregnancy with 27 completed weeks gestation Her current pregnancy is complicated by fetal growth restriction that is currently followed by Maternal Fetal Medicine.  Management per OB    HOSPITALIST TEAM WILL SIGN OFF AT THIS POINT 06/17/23 please do not hesitate to contact us  as needed.

## 2023-06-15 NOTE — Progress Notes (Addendum)
 Pt transported to [facility] for NST via wheelchair. Pt denies ctx at this time, endorses positive fetal movement, and denies lof or vaginal bleeding.

## 2023-06-15 NOTE — Progress Notes (Signed)
  Brief Progress Note (See full H&P from earlier today)    Hospital course / significant events:   HPI:  Jasmine Perkins is a 28 y.o. female with past medical history of At [redacted] weeks gestation being consulted on for submandibular space infection.  She presented to the ED with pain in the right lower jaw, with difficulty opening her mouth, swallowing and managing her secretions.   04/15: to ED. CT showed no organized fluid collection, ENT advised no ENT follow-up needed. Admitted to [facility] w/ hospitalist consult. 04/16: improving, swallowing okay, may advance diet. Advise remain on IV abx at least 48 h, will recheck tomorrow    CT soft tissue neck showing the following: IMPRESSION: 1. Edema involving the right submandibular space with extension to involve the right floor mouth and right masticator space, most likely infectious cellulitis/phlegmon. No discrete, drainable fluid collection. 2. Mildly irregular soft tissue nodularity in the submental space is likely similar in comparison to remote 2015 prior.        ASSESSMENT & PLAN:   Submandibular space infection Continue Unasyn Will hold off further Decadron due to pregnancy, risk of worsening infection and no obstructing fluid collection If continued swelling and trismus, will consider Decadron once okayed by OB Pain control Soft diet   Pregnancy with 27 completed weeks gestation Her current pregnancy is complicated by fetal growth restriction that is currently followed by Maternal Fetal Medicine.  Management per OB         Subjective: Pt reports jaw/cheek still painful, swallowing is a bit easier today, no fever/chills   Objective: Relevant new results: none Physical Exam:  BP 124/85 (BP Location: Right Arm)   Pulse 81   Temp 99.2 F (37.3 C) (Oral)   Resp 18   Ht 5\' 7"  (1.702 m)   Wt 81.6 kg   LMP 12/06/2022   SpO2 100%   BMI 28.19 kg/m  Constitutional:  General Appearance: alert, well-developed,  well-nourished, NAD HEENT:  R submandibular/mandibular region visibly swollen, tender on palpation, no erythema no cervical lymphadenopathy  Respiratory: Normal respiratory effort Breath sounds normal, no wheeze/rhonchi/rales Cardiovascular: S1/S2 normal, no murmur/rub/gallop auscultated No lower extremity edema Neurological: No cranial nerve deficit on limited exam Psychiatric: Normal judgment/insight Normal mood and affect

## 2023-06-15 NOTE — Progress Notes (Signed)
 Antibiotic therapy:  -Discussed abx frequency with pharmacy. Unasyn 3 grams every 6 hours ordered.  -Repeat CBC in AM  Pain Management -IV Tylenol every 8 hours until able to tolerate PO intake  -IV morphine PRN for breakthrough pain  Diet -Clear liquid diet, advance to soft diet as tolerated  -Maintenance IV fluids ordered until able to tolerate PO intake   Fraser Jackson, CNM Certified Nurse Midwife [place]  [facility]

## 2023-06-15 NOTE — Progress Notes (Signed)
 ANTEPARTUM PROGRESS NOTE  Jasmine Perkins is a 28 y.o. G1P0 at [redacted]w[redacted]d who is admitted for submandibular space infection.   Estimated Date of Delivery: 09/12/23  Length of Stay:  0 Days. Admitted 06/14/2023  Subjective: Patient reports improvement of pain and facial swelling since beginning IV antibiotics in hospital. Denies fever and chills.   She reports:  -active fetal movement -no leakage of fluid -no vaginal bleeding -no contractions  Vitals:  BP 116/80 (BP Location: Right Arm)   Pulse 85   Temp 98.5 F (36.9 C) (Oral)   Resp 18   Ht 5\' 7"  (1.702 m)   Wt 81.6 kg   LMP 12/06/2022   SpO2 100%   BMI 28.19 kg/m  Physical Examination: General:   alert, cooperative, appears stated age, and no distress  Skin:  normal  Neurologic:    Alert & oriented x 3  Lungs:    Normal effort  Heart:   regular rate and rhythm  Abdomen:   deferred  Extremities: : Normal    Fetal monitoring: FHR: 145 bpm, Variability: moderate, Accelerations10x10: Present, Decelerations: Absent Uterine activity: None contractions per hour  Results for orders placed or performed during the hospital encounter of 06/14/23 (from the past 48 hours)  Group A Strep by PCR ([facility] Only)     Status: None   Collection Time: 06/14/23  5:48 PM   Specimen: Throat; Sterile Swab  Result Value Ref Range   Group A Strep by PCR NOT DETECTED NOT DETECTED    Comment: Performed at [facility], [address] [place], [place] [address]  Basic metabolic panel     Status: Abnormal   Collection Time: 06/14/23  7:38 PM  Result Value Ref Range   Sodium 134 (L) 135 - 145 mmol/L   Potassium 3.8 3.5 - 5.1 mmol/L   Chloride 102 98 - 111 mmol/L   CO2 22 22 - 32 mmol/L   Glucose, Bld 97 70 - 99 mg/dL    Comment: Glucose reference range applies only to samples taken after fasting for at least 8 hours.   BUN 6 6 - 20 mg/dL   Creatinine, Ser 6.04 0.44 - 1.00 mg/dL   Calcium 8.8 (L) 8.9 - 10.3 mg/dL   GFR, Estimated >54 >09  mL/min    Comment: (NOTE) Calculated using the CKD-EPI Creatinine Equation (2021)    Anion gap 10 5 - 15    Comment: Performed at [facility], [address] [place], [place] [address]  CBC with Differential     Status: Abnormal   Collection Time: 06/14/23  7:38 PM  Result Value Ref Range   WBC 12.0 (H) 4.0 - 10.5 K/uL   RBC 4.34 3.87 - 5.11 MIL/uL   Hemoglobin 12.6 12.0 - 15.0 g/dL   HCT 47.8 29.5 - 62.1 %   MCV 86.4 80.0 - 100.0 fL   MCH 29.0 26.0 - 34.0 pg   MCHC 33.6 30.0 - 36.0 g/dL   RDW 30.8 65.7 - 84.6 %   Platelets 249 150 - 400 K/uL   nRBC 0.0 0.0 - 0.2 %   Neutrophils Relative % 83 %   Neutro Abs 9.8 (H) 1.7 - 7.7 K/uL   Lymphocytes Relative 10 %   Lymphs Abs 1.2 0.7 - 4.0 K/uL   Monocytes Relative 7 %   Monocytes Absolute 0.9 0.1 - 1.0 K/uL   Eosinophils Relative 0 %   Eosinophils Absolute 0.0 0.0 - 0.5 K/uL   Basophils Relative 0 %  Basophils Absolute 0.0 0.0 - 0.1 K/uL   Immature Granulocytes 0 %   Abs Immature Granulocytes 0.05 0.00 - 0.07 K/uL    Comment: Performed at [facility], [address] [place], [place] [address]  Lactic acid, plasma     Status: None   Collection Time: 06/15/23  1:06 AM  Result Value Ref Range   Lactic Acid, Venous 1.3 0.5 - 1.9 mmol/L    Comment: Performed at [facility], [address] [place], [place] [address]  CBC     Status: Abnormal   Collection Time: 06/15/23  5:43 AM  Result Value Ref Range   WBC 12.1 (H) 4.0 - 10.5 K/uL   RBC 3.97 3.87 - 5.11 MIL/uL   Hemoglobin 11.1 (L) 12.0 - 15.0 g/dL   HCT 82.9 (L) 56.2 - 13.0 %   MCV 84.6 80.0 - 100.0 fL   MCH 28.0 26.0 - 34.0 pg   MCHC 33.0 30.0 - 36.0 g/dL   RDW 86.5 78.4 - 69.6 %   Platelets 229 150 - 400 K/uL   nRBC 0.0 0.0 - 0.2 %    Comment: Performed at [facility], [address], [place], [place] [address]    CT Soft Tissue Neck W Contrast Result Date: 06/14/2023 CLINICAL DATA:  Epiglottitis or tonsillitis suspected  Peritonsilar abscess + submandibular space fullness EXAM: CT NECK WITH CONTRAST TECHNIQUE: Multidetector CT imaging of the neck was performed using the standard protocol following the bolus administration of intravenous contrast. RADIATION DOSE REDUCTION: This exam was performed according to the departmental dose-optimization program which includes automated exposure control, adjustment of the mA and/or kV according to patient size and/or use of iterative reconstruction technique. CONTRAST:  75mL OMNIPAQUE IOHEXOL 300 MG/ML  SOLN COMPARISON:  None Available. FINDINGS: Pharynx/larynx and surrounding soft tissues: Edema involving the right submandibular space with extension to involve the right floor mouth and right masticator space. No discrete, drainable fluid collection Salivary glands: Edema surrounding the right submandibular gland but the gland itself appears within normal limits. Normal appearance of the parotid glands. No calculi. No ductal dilation Thyroid: Subcentimeter thyroid nodules do not require further imaging follow-up (ref: J Am Coll Radiol. 2015 Feb;12(2): 143-50). Lymph nodes: Mildly prominent right submandibular lymph nodes. Area of submental nodularity seen on remote CT neck is likely still present and similar. Vascular: Not well evaluated due to non arterial timing. Limited intracranial: Negative. Visualized orbits: Negative. Mastoids and visualized paranasal sinuses: Clear. Skeleton: No acute or aggressive process. Upper chest: Visualized lung apices are clear. IMPRESSION: 1. Edema involving the right submandibular space with extension to involve the right floor mouth and right masticator space, most likely infectious cellulitis/phlegmon. No discrete, drainable fluid collection. 2. Mildly irregular soft tissue nodularity in the submental space is likely similar in comparison to remote 2015 prior. Electronically Signed   By: Feliberto Harts M.D.   On: 06/14/2023 22:45    Current scheduled  medications   I have reviewed the patient's current medications.  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 Admission for submandibular space infection  - Admission status: Observation -  Discussed plan of care with Dr Laird Pih MD - Soft diet as tolerated  - Activity as tolerated  - VS per routine    2. Routine antenatal - No pregnancy complaints currently  - NST q shift    3. Submandibular space infection  - CT imaging completed - no discrete, drainable fluid collection  - Will continue Unasyn 3 grams q 6 hours per recommendations  - ENT consulted and they reviewed imaging. No indication for ENT follow up at this time.  - Pain management: IV medication until able to tolerate more PO intake  - Appreciate [facility] recommendations for management  Continue routine antenatal care.  Aron Lard, [place] 06/15/2023 10:54 AM  Collin Deal Certified Nurse Midwife [facility]

## 2023-06-15 NOTE — Consult Note (Addendum)
 Initial Consultation Note   Patient: Jasmine Perkins ZOX:[id number] DOB: November 10, 1995 PCP: Anola Gurney, PA DOA: 06/14/2023 DOS: the patient was seen and examined on 06/15/2023 Primary service: Gustavo Lah, CNM  Referring physician: [facility] Reason for consult: submandibular space infection  Assessment/Plan: Assessment and Plan: * Submandibular space infection Continue Unasyn Will hold off further Decadron due to pregnancy, risk of worsening infection and no obstructing fluid collection If continued swelling and trismus, will consider Decadron once okayed by OB Pain control Soft diet  Pregnancy with 27 completed weeks gestation Management per OB       [facility] will continue to follow the patient.  HPI: Jasmine Perkins is a 28 y.o. female with past medical history of At [redacted] weeks gestation being consulted on for submandibular space infection.  She presented to the ED with pain in the right lower jaw, with difficulty opening her mouth, swallowing and managing her secretions.  She denies shortness of breath.She endorses chills but no fevers. ED course and data review: Tmax 99, pulse 99 with otherwise normal vitals. Labs notable for WBC 12,000.  Lactic acid not done CT soft tissue neck showing the following: IMPRESSION: 1. Edema involving the right submandibular space with extension to involve the right floor mouth and right masticator space, most likely infectious cellulitis/phlegmon. No discrete, drainable fluid collection. 2. Mildly irregular soft tissue nodularity in the submental space is likely similar in comparison to remote 2015 prior.  The ED provider spoke with both OB and ENT prior to getting the CAT scan.  Patient was agreeable to CT scan.  Following results which showed no organized fluid collection, ENT advised no ENT follow-up needed.  Patient will be [facility] to the [facility] service due to being 7 months pregnant.  She has no OB related concerns.  Patient was given Unasyn and  Decadron in the ED.Marland Kitchen  Review of Systems: As mentioned in the history of present illness. All other systems reviewed and are negative. History reviewed. No pertinent past medical history. Past Surgical History:  Procedure Laterality Date   submental lymph node biopsy N/A    Social History:  reports that she has never smoked. She has never used smokeless tobacco. She reports that she does not drink alcohol and does not use drugs.  No Known Allergies  Family History  Problem Relation Age of Onset   Healthy Mother    Hypertension Father    Heart attack Father    Breast cancer Maternal Grandmother    Healthy Sister    Healthy Brother    Healthy Sister     Prior to Admission medications   Medication Sig Start Date End Date Taking? Authorizing Provider  Ferrous Sulfate (IRON PO) Take 1 tablet by mouth daily.   Yes [provider]  ondansetron (ZOFRAN) 4 MG tablet Take 1 tablet (4 mg total) by mouth daily as needed. 10/17/22 10/17/23 Yes Pilar Jarvis, MD  Prenatal Vit-DSS-Fe Fum-FA (PRENATAL 19) tablet Take 1 tablet by mouth daily.    [provider]    Physical Exam: Vitals:   06/14/23 1845 06/14/23 1930 06/14/23 2230 06/14/23 2300  BP:  136/77 117/73 121/87  Pulse: 86 81 85 99  Resp:  18  14  Temp:      SpO2: 100% 100% 99% 100%  Weight:      Height:       Physical Exam Vitals and nursing note reviewed.  Constitutional:      General: She is not in acute distress. HENT:  Head: Normocephalic and atraumatic.     Comments: Swelling and fullness right jaw extending from mid lower jaw to angle of jaw.  Tenderness on palpation Cardiovascular:     Rate and Rhythm: Normal rate and regular rhythm.     Heart sounds: Normal heart sounds.  Pulmonary:     Effort: Pulmonary effort is normal.     Breath sounds: Normal breath sounds.  Abdominal:     Palpations: Abdomen is soft.     Tenderness: There is no abdominal tenderness.  Neurological:     Mental Status:  Mental status is at baseline.     Data Reviewed: Relevant notes from primary care and specialist visits, past discharge summaries as available in EHR, including Care Everywhere. Prior diagnostic testing as pertinent to current admission diagnoses Updated medications and problem lists for reconciliation ED course, including vitals, labs, imaging, treatment and response to treatment Triage notes, nursing and pharmacy notes and ED provider's notes Notable results as noted in HPI   Family Communication: partner at bedside Primary team communication: OB Thank you very much for involving us  in the care of your patient.  Author: Lanetta Pion, MD 06/15/2023 12:10 AM  For on call review [email or web address].

## 2023-06-15 NOTE — Assessment & Plan Note (Addendum)
 Continue Unasyn Will hold off further Decadron due to pregnancy, risk of worsening infection and no obstructing fluid collection If continued swelling and trismus, will consider Decadron once okayed by OB Pain control Soft diet

## 2023-06-16 ENCOUNTER — Observation Stay

## 2023-06-16 DIAGNOSIS — Z3A27 27 weeks gestation of pregnancy: Secondary | ICD-10-CM | POA: Diagnosis not present

## 2023-06-16 DIAGNOSIS — K122 Cellulitis and abscess of mouth: Secondary | ICD-10-CM | POA: Diagnosis present

## 2023-06-16 DIAGNOSIS — Z8249 Family history of ischemic heart disease and other diseases of the circulatory system: Secondary | ICD-10-CM | POA: Diagnosis not present

## 2023-06-16 DIAGNOSIS — O365920 Maternal care for other known or suspected poor fetal growth, second trimester, not applicable or unspecified: Secondary | ICD-10-CM | POA: Diagnosis present

## 2023-06-16 DIAGNOSIS — R6884 Jaw pain: Secondary | ICD-10-CM | POA: Diagnosis present

## 2023-06-16 DIAGNOSIS — O99891 Other specified diseases and conditions complicating pregnancy: Secondary | ICD-10-CM | POA: Diagnosis present

## 2023-06-16 LAB — CBC
HCT: 34.4 % — ABNORMAL LOW (ref 36.0–46.0)
Hemoglobin: 11.4 g/dL — ABNORMAL LOW (ref 12.0–15.0)
MCH: 28.4 pg (ref 26.0–34.0)
MCHC: 33.1 g/dL (ref 30.0–36.0)
MCV: 85.8 fL (ref 80.0–100.0)
Platelets: 233 10*3/uL (ref 150–400)
RBC: 4.01 MIL/uL (ref 3.87–5.11)
RDW: 13 % (ref 11.5–15.5)
WBC: 9.9 10*3/uL (ref 4.0–10.5)
nRBC: 0 % (ref 0.0–0.2)

## 2023-06-16 MED ORDER — LACTATED RINGERS IV SOLN: INTRAVENOUS | Status: DC

## 2023-06-16 MED ORDER — MAGNESIUM SULFATE 40 GM/1000ML IV SOLN
2.0000 g/h | INTRAVENOUS | Status: DC
  Administered 2023-06-16: 2 g/h via INTRAVENOUS
  Filled 2023-06-16: qty 1000

## 2023-06-16 MED ORDER — LACTATED RINGERS IV SOLN: 125.0000 mL/h | INTRAVENOUS | Status: DC

## 2023-06-16 MED ORDER — ONDANSETRON HCL 4 MG/2ML IJ SOLN
4.0000 mg | Freq: Four times a day (QID) | INTRAMUSCULAR | Status: DC | PRN
  Administered 2023-06-16: 4 mg via INTRAVENOUS
  Filled 2023-06-16: qty 2

## 2023-06-16 MED ORDER — MAGNESIUM SULFATE BOLUS VIA INFUSION
4.0000 g | Freq: Once | INTRAVENOUS | Status: AC
  Administered 2023-06-16: 4 g via INTRAVENOUS
  Filled 2023-06-16: qty 1000

## 2023-06-16 MED ORDER — TERBUTALINE SULFATE 1 MG/ML IJ SOLN: 0.2500 mg | Freq: Once | INTRAMUSCULAR | Status: DC | PRN

## 2023-06-16 MED ORDER — BETAMETHASONE SOD PHOS & ACET 6 (3-3) MG/ML IJ SUSP
12.0000 mg | INTRAMUSCULAR | Status: DC
  Administered 2023-06-16: 12 mg via INTRAMUSCULAR
  Filled 2023-06-16: qty 5
  Filled 2023-06-16: qty 2

## 2023-06-16 MED ORDER — SODIUM CHLORIDE 0.9 % IV SOLN: INTRAVENOUS | Status: AC | PRN

## 2023-06-16 NOTE — Progress Notes (Incomplete)
 ANTEPARTUM PROGRESS NOTE  Jasmine Perkins is a 28 y.o. G1P0 at [redacted]w[redacted]d who is admitted for submandibular abscess Estimated Date of Delivery: 09/12/23  Length of Stay:  0 Days. Admitted 06/14/2023  Subjective: She reports:  -active fetal movement -no leakage of fluid -no vaginal bleeding -no contractions  Vitals:  BP 117/80   Pulse 90   Temp 98.8 F (37.1 C) (Oral)   Resp 18   Ht 5\' 7"  (1.702 m)   Wt 81.6 kg   LMP 12/06/2022   SpO2 100%   BMI 28.19 kg/m  Physical Examination: OBGyn Exam  ZOX:[id number]: IUGR 1.4%tile Baseline FHR: 145 beats/min Variability: moderate Accelerations: present Decelerations: present, variables  Tocometry: quiet   Chari Manning, CNM   Results for orders placed or performed during the hospital encounter of 06/14/23 (from the past 48 hours)  Lactic acid, plasma     Status: None   Collection Time: 06/15/23  1:06 AM  Result Value Ref Range   Lactic Acid, Venous 1.3 0.5 - 1.9 mmol/L    Comment: Performed at [facility], [address] [place], [place] [address]  CBC     Status: Abnormal   Collection Time: 06/15/23  5:43 AM  Result Value Ref Range   WBC 12.1 (H) 4.0 - 10.5 K/uL   RBC 3.97 3.87 - 5.11 MIL/uL   Hemoglobin 11.1 (L) 12.0 - 15.0 g/dL   HCT 82.9 (L) 56.2 - 13.0 %   MCV 84.6 80.0 - 100.0 fL   MCH 28.0 26.0 - 34.0 pg   MCHC 33.0 30.0 - 36.0 g/dL   RDW 86.5 78.4 - 69.6 %   Platelets 229 150 - 400 K/uL   nRBC 0.0 0.0 - 0.2 %    Comment: Performed at [facility], [address] [place], [place] [address]  CBC     Status: Abnormal   Collection Time: 06/16/23  5:41 AM  Result Value Ref Range   WBC 9.9 4.0 - 10.5 K/uL   RBC 4.01 3.87 - 5.11 MIL/uL   Hemoglobin 11.4 (L) 12.0 - 15.0 g/dL   HCT 41.3 (L) 24.4 - 01.0 %   MCV 85.8 80.0 - 100.0 fL   MCH 28.4 26.0 - 34.0 pg   MCHC 33.1 30.0 - 36.0 g/dL   RDW 27.2 53.6 - 64.4 %   Platelets 233 150 - 400 K/uL   nRBC 0.0 0.0 - 0.2 %    Comment: Performed at  [facility], [address] [place], [place] [address]    US OB Comp + 14 Wk Result Date: 06/16/2023 CLINICAL DATA:  IUGR intrauterine growth restriction EXAM: OBSTETRICAL ULTRASOUND >14 WKS FINDINGS: Number of Fetuses: 1 Heart Rate:  144 bpm Movement: Yes Presentation: Cephalic/transverse Previa: No Placental Location: Posterior fundal Amniotic Fluid (Subjective): Normal Amniotic Fluid (Objective): Vertical pocket = 5.3cm FETAL BIOMETRY BPD: 6.2cm 25w 0d HC:   23.1cm 25w d AC:   20.2cm 24w 6d FL:   4.9cm 26w is 3d Current Mean GA: 25w 4d [place] EDC: 09/25/2023 Assigned GA:  27w 3d Assigned EDC: 09/12/2023 Estimated Fetal Weight:  107g 1.4%ile FETAL ANATOMY Lateral Ventricles: Appears normal Thalami/CSP: Appears normal Posterior Fossa:  Appears normal Nuchal Region: Appears normal   NFT= 5.1 Upper Lip: Not well visualized Spine: Limited views appear normal 4 Chamber Heart on Left: Appears normal LVOT: Not visualized RVOT: Not visualized Stomach on Left: Appears normal 3 Vessel Cord: Appears normal Cord Insertion site: Limited views appear normal Kidneys: Appears normal  Bladder: Appears normal Extremities: Limited views appear normal Sex: Female Technically difficult due to: Fetal position Maternal Findings: Cervix:  3.2 cm transabdominal IMPRESSION: 1. Single live intrauterine gestation measuring 25 weeks 4 days on today's ultrasound. Assigned gestational age is 27 weeks and 3 days. Findings are worrisome for intrauterine growth restriction, likely symmetric. 2. Estimated fetal weight 1.4%. 3. Fetal anatomic survey is incomplete and limited due to gestational age. Electronically Signed   By: Tyron Gallon M.D.   On: 06/16/2023 18:02    Current scheduled medications . acetaminophen  1,000 mg Oral Q6H  . betamethasone acetate-betamethasone sodium phosphate  12 mg Intramuscular Q24H  . magnesium  4 g Intravenous Once    I have reviewed the patient's current medications.  ASSESSMENT: Patient  Active Problem List   Diagnosis Date Noted  . Pregnancy with 27 completed weeks gestation 06/15/2023  . 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 Intrauterine growth restriction affecting care of mother, unspecified trimester, not applicable or unspecified fetus 05/17/2023  . Choroid plexus cysts, fetal, affecting care of mother, antepartum 04/13/2023  . Supervision of high risk pregnancy in second trimester 04/12/2023  . 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 intrauterine growth restriction (IUGR) -IV magnesium sulfate, 4 gm bolus, 2g/hr -betamethasone 1st dose given 06/16/23 @ 2334 -continuous fetal monitoring  -Transfer to [facility] in morning -I consulted with Dr. Lattie Poli at [facility], who advised postponing the transfer until the morning. He disagreed with our current care plan involving Magnesium sulfate for neuroprotection, indicating that if the patient remains stable and there are no acute issues, the transfer can be delayed until the morning.  I explained our preference to transfer her care while she remains stable and noted that we are not equipped to handle a severe IUGR baby at 27 weeks, currently measuring in the 1st percentile, should an acute situation arise. submandibular abscess  -IV rocephin, unasyn  Lucinda Spells, CNM 06/16/2023 11:42 PM  Arzella Laurence Certified Nurse Midwife [facility]

## 2023-06-16 NOTE — Progress Notes (Signed)
 PROGRESS NOTE    Jasmine Perkins   [name] DOB: [birth year]  DOA: 06/14/2023 Date of Service: 06/16/23 which is hospital day 0  PCP: Anola Gurney, [facility] course / significant events:   HPI:  Jasmine Perkins is a 28 y.o. female with past medical history of At [redacted] weeks gestation being consulted on for submandibular space infection.  She presented to the ED with pain in the right lower jaw, with difficulty opening her mouth, swallowing and managing her secretions.   04/15: to ED. CT showed no organized fluid collection, ENT advised no ENT follow-up needed. Admitted to [facility] w/ hospitalist consult. 04/16: improving, swallowing okay, opens mouth some but hurt if goes too far, may advance diet as requested but avoid chewing hard foods. Advise remain on IV abx at least 48 h, will recheck tomorrow  04/17: WBC normalized, swelling improved but still present. Pt reports pain w/ opening jaw is about same as yesterday   CT soft tissue neck showing the following: IMPRESSION: 1. Edema involving the right submandibular space with extension to involve the right floor mouth and right masticator space, most likely infectious cellulitis/phlegmon. No discrete, drainable fluid collection. 2. Mildly irregular soft tissue nodularity in the submental space is likely similar in comparison to remote 2015 prior.        ASSESSMENT & PLAN:   Submandibular space infection Continue Unasyn at least 48h Will hold off further Decadron due to pregnancy, risk of worsening infection and no obstructing fluid collection - has been improving without it  Reimage if worse No ENT need at this time, no operable mass/abscess  Pain control Soft diet as desired    Pregnancy with 27 completed weeks gestation Her current pregnancy is complicated by fetal growth restriction that is currently followed by Maternal Fetal Medicine.  Management per OB               Subjective / Brief ROS:  Patient  reports pain is maybe slightly better but probably about same as yesterday though swelling seems better, still painful to open mouth too wide Denies CP/SOB.  Pain controlled.  Denies new weakness.  Tolerating diet if liquids/soft.  Reports no concerns w/ urination/defecation.       Objective Findings:  Vitals:   06/15/23 2313 06/16/23 0312 06/16/23 0852 06/16/23 1153  BP: 101/63 108/73 106/71 110/71  Pulse: 93 95 89 89  Resp: 20 18 18 18   Temp: 98.2 F (36.8 C) 98.3 F (36.8 C) 99 F (37.2 C) 99.3 F (37.4 C)  TempSrc:   Oral Oral  SpO2: 99% 100% 100% 100%  Weight:      Height:        Intake/Output Summary (Last 24 hours) at 06/16/2023 1422 Last data filed at 06/16/2023 0900 Gross per 24 hour  Intake 2876.44 ml  Output 1900 ml  Net 976.44 ml   Filed Weights   06/14/23 1743  Weight: 81.6 kg    Examination:  Physical Exam HENT:     Head:     Jaw: Swelling (on R madible area appears improved from yesterday) and pain on movement present.     Mouth/Throat:     Mouth: Mucous membranes are moist.          Scheduled Medications:   acetaminophen  1,000 mg Oral Q6H    Continuous Infusions:  sodium chloride 10 mL/hr at 06/16/23 0319   ampicillin-sulbactam (UNASYN) IV 3 g (06/16/23 0918)    PRN Medications:  sodium  chloride, morphine injection  Antimicrobials from admission:  Anti-infectives (From admission, onward)    Start     Dose/Rate Route Frequency Ordered Stop   06/15/23 0400  Ampicillin-Sulbactam (UNASYN) 3 g in sodium chloride 0.9 % 100 mL IVPB        3 g 200 mL/hr over 30 Minutes Intravenous Every 6 hours 06/15/23 0248     06/14/23 2045  Ampicillin-Sulbactam (UNASYN) 3 g in sodium chloride 0.9 % 100 mL IVPB        3 g 200 mL/hr over 30 Minutes Intravenous  Once 06/14/23 2043 06/14/23 2308           Data Reviewed:  I have personally reviewed the following...  CBC: Recent Labs  Lab 06/14/23 1938 06/15/23 0543 06/16/23 0541  WBC  12.0* 12.1* 9.9  NEUTROABS 9.8*  --   --   HGB 12.6 11.1* 11.4*  HCT 37.5 33.6* 34.4*  MCV 86.4 84.6 85.8  PLT 249 229 233   Basic Metabolic Panel: Recent Labs  Lab 06/14/23 1938  NA 134*  K 3.8  CL 102  CO2 22  GLUCOSE 97  BUN 6  CREATININE 0.71  CALCIUM 8.8*   GFR: Estimated Creatinine Clearance: 117.1 mL/min (by C-G formula based on SCr of 0.71 mg/dL). Liver Function Tests: No results for input(s): "AST", "ALT", "ALKPHOS", "BILITOT", "PROT", "ALBUMIN" in the last 168 hours. No results for input(s): "LIPASE", "AMYLASE" in the last 168 hours. No results for input(s): "AMMONIA" in the last 168 hours. Coagulation Profile: No results for input(s): "INR", "PROTIME" in the last 168 hours. Cardiac Enzymes: No results for input(s): "CKTOTAL", "CKMB", "CKMBINDEX", "TROPONINI" in the last 168 hours. BNP (last 3 results) No results for input(s): "PROBNP" in the last 8760 hours. HbA1C: No results for input(s): "HGBA1C" in the last 72 hours. CBG: No results for input(s): "GLUCAP" in the last 168 hours. Lipid Profile: No results for input(s): "CHOL", "HDL", "LDLCALC", "TRIG", "CHOLHDL", "LDLDIRECT" in the last 72 hours. Thyroid Function Tests: No results for input(s): "TSH", "T4TOTAL", "FREET4", "T3FREE", "THYROIDAB" in the last 72 hours. Anemia Panel: No results for input(s): "VITAMINB12", "FOLATE", "FERRITIN", "TIBC", "IRON", "RETICCTPCT" in the last 72 hours. Most Recent Urinalysis On File:  No results found for: "COLORURINE", "APPEARANCEUR", "LABSPEC", "PHURINE", "GLUCOSEU", "HGBUR", "BILIRUBINUR", "KETONESUR", "PROTEINUR", "UROBILINOGEN", "NITRITE", "LEUKOCYTESUR" Sepsis Labs: @LABRCNTIP (procalcitonin:4,lacticidven:4) Microbiology: Recent Results (from the past 240 hours)  Group A Strep by PCR ([facility] Only)     Status: None   Collection Time: 06/14/23  5:48 PM   Specimen: Throat; Sterile Swab  Result Value Ref Range Status   Group A Strep by PCR NOT DETECTED NOT DETECTED  Final    Comment: Performed at [facility], [address] [place], [place] [address]      Radiology Studies last 3 days: CT Soft Tissue Neck W Contrast Result Date: 06/14/2023 CLINICAL DATA:  Epiglottitis or tonsillitis suspected Peritonsilar abscess + submandibular space fullness EXAM: CT NECK WITH CONTRAST TECHNIQUE: Multidetector CT imaging of the neck was performed using the standard protocol following the bolus administration of intravenous contrast. RADIATION DOSE REDUCTION: This exam was performed according to the departmental dose-optimization program which includes automated exposure control, adjustment of the mA and/or kV according to patient size and/or use of iterative reconstruction technique. CONTRAST:  75mL OMNIPAQUE IOHEXOL 300 MG/ML  SOLN COMPARISON:  None Available. FINDINGS: Pharynx/larynx and surrounding soft tissues: Edema involving the right submandibular space with extension to involve the right floor mouth and right masticator space. No discrete,  drainable fluid collection Salivary glands: Edema surrounding the right submandibular gland but the gland itself appears within normal limits. Normal appearance of the parotid glands. No calculi. No ductal dilation Thyroid: Subcentimeter thyroid nodules do not require further imaging follow-up (ref: J Am Coll Radiol. 2015 Feb;12(2): 143-50). Lymph nodes: Mildly prominent right submandibular lymph nodes. Area of submental nodularity seen on remote CT neck is likely still present and similar. Vascular: Not well evaluated due to non arterial timing. Limited intracranial: Negative. Visualized orbits: Negative. Mastoids and visualized paranasal sinuses: Clear. Skeleton: No acute or aggressive process. Upper chest: Visualized lung apices are clear. IMPRESSION: 1. Edema involving the right submandibular space with extension to involve the right floor mouth and right masticator space, most likely infectious cellulitis/phlegmon. No  discrete, drainable fluid collection. 2. Mildly irregular soft tissue nodularity in the submental space is likely similar in comparison to remote 2015 prior. Electronically Signed   By: Stevenson Elbe M.D.   On: 06/14/2023 22:45        Melodi Sprung, DO [facility] 06/16/2023, 2:22 PM    Dictation software may have been used to generate the above note. Typos may occur and escape review in typed/dictated notes. Please contact Dr Authur Leghorn directly for clarity if needed.  Staff may message me via secure chat in Epic  but this may not receive an immediate response,  please page me for urgent matters!  If 7PM-7AM, please contact night coverage [email or web address]

## 2023-06-16 NOTE — Progress Notes (Signed)
 MFM Plan of care I was notified by the [facility] provider about this patient's admission.  She was seen in the office around 23 weeks and was noted to have fetal growth restriction.  She was scheduled to follow-up in May for growth ultrasound and umbilical artery Dopplers as well as an MFM consult.  She is now 27 weeks 3 days admitted for a submandibular abscess and undergoing antibiotic treatment.  There is no evidence of a surgical site to drain.  She is clinically improving.  We discussed fetal monitoring should occur during the hospital and has been achieved with nonstress tests that have appeared reassuring. A growth ultrasound will be ordered.  We do not have onsite MFM or sonographers able to perform umbilical artery Dopplers until next week.  Since the NST has been reassuring it is appropriate to obtain a fetal growth ultrasound and wait until Monday.  If there are any acute concerns based on the fetal tracing the OB team will assess the patient and provide the appropriate management.    Plan  - Growth anatomy to be done by radiology - NST at least BID - UA doppler to be done Monday if the fetus is still growth restricted

## 2023-06-16 NOTE — Progress Notes (Signed)
 ANTEPARTUM PROGRESS NOTE  Jasmine Perkins is a 28 y.o. G1P0 at [redacted]w[redacted]d who is admitted for submandibular abscess Estimated Date of Delivery: 09/12/23  Length of Stay:  0 Days. Admitted 06/14/2023  Subjective: She reports:  -active fetal movement -no leakage of fluid -no vaginal bleeding -no contractions  Vitals:  BP 117/80   Pulse 90   Temp 98.8 F (37.1 C) (Oral)   Resp 18   Ht 5\' 7"  (1.702 m)   Wt 81.6 kg   LMP 12/06/2022   SpO2 100%   BMI 28.19 kg/m  Physical Examination: OBGyn Exam  [id number]: IUGR 1.4%tile Baseline FHR: 145 beats/min Variability: moderate Accelerations: present Decelerations: present, variables  Tocometry: quiet   Arzella Laurence, CNM   Results for orders placed or performed during the hospital encounter of 06/14/23 (from the past 48 hours)  Lactic acid, plasma     Status: None   Collection Time: 06/15/23  1:06 AM  Result Value Ref Range   Lactic Acid, Venous 1.3 0.5 - 1.9 mmol/L    Comment: Performed at [facility], [address] [place], [place] [address]  CBC     Status: Abnormal   Collection Time: 06/15/23  5:43 AM  Result Value Ref Range   WBC 12.1 (H) 4.0 - 10.5 K/uL   RBC 3.97 3.87 - 5.11 MIL/uL   Hemoglobin 11.1 (L) 12.0 - 15.0 g/dL   HCT 82.9 (L) 56.2 - 13.0 %   MCV 84.6 80.0 - 100.0 fL   MCH 28.0 26.0 - 34.0 pg   MCHC 33.0 30.0 - 36.0 g/dL   RDW 86.5 78.4 - 69.6 %   Platelets 229 150 - 400 K/uL   nRBC 0.0 0.0 - 0.2 %    Comment: Performed at [facility], [address] [place], [place] [address]  CBC     Status: Abnormal   Collection Time: 06/16/23  5:41 AM  Result Value Ref Range   WBC 9.9 4.0 - 10.5 K/uL   RBC 4.01 3.87 - 5.11 MIL/uL   Hemoglobin 11.4 (L) 12.0 - 15.0 g/dL   HCT 41.3 (L) 24.4 - 01.0 %   MCV 85.8 80.0 - 100.0 fL   MCH 28.4 26.0 - 34.0 pg   MCHC 33.1 30.0 - 36.0 g/dL   RDW 27.2 53.6 - 64.4 %   Platelets 233 150 - 400 K/uL   nRBC 0.0 0.0 - 0.2 %    Comment: Performed at  [facility], [address] [place], [place] [address]    US  OB Comp + 14 Wk Result Date: 06/16/2023 CLINICAL DATA:  IUGR intrauterine growth restriction EXAM: OBSTETRICAL ULTRASOUND >14 WKS FINDINGS: Number of Fetuses: 1 Heart Rate:  144 bpm Movement: Yes Presentation: Cephalic/transverse Previa: No Placental Location: Posterior fundal Amniotic Fluid (Subjective): Normal Amniotic Fluid (Objective): Vertical pocket = 5.3cm FETAL BIOMETRY BPD: 6.2cm 25w 0d HC:   23.1cm 25w d AC:   20.2cm 24w 6d FL:   4.9cm 26w is 3d Current Mean GA: 25w 4d US  EDC: 09/25/2023 Assigned GA:  27w 3d Assigned EDC: 09/12/2023 Estimated Fetal Weight:  107g 1.4%ile FETAL ANATOMY Lateral Ventricles: Appears normal Thalami/CSP: Appears normal Posterior Fossa:  Appears normal Nuchal Region: Appears normal   NFT= 5.1 Upper Lip: Not well visualized Spine: Limited views appear normal 4 Chamber Heart on Left: Appears normal LVOT: Not visualized RVOT: Not visualized Stomach on Left: Appears normal 3 Vessel Cord: Appears normal Cord Insertion site: Limited views appear normal Kidneys: Appears normal  Bladder: Appears normal Extremities: Limited views appear normal Sex: Female Technically difficult due to: Fetal position Maternal Findings: Cervix:  3.2 cm transabdominal IMPRESSION: 1. Single live intrauterine gestation measuring 25 weeks 4 days on today's ultrasound. Assigned gestational age is 27 weeks and 3 days. Findings are worrisome for intrauterine growth restriction, likely symmetric. 2. Estimated fetal weight 1.4%. 3. Fetal anatomic survey is incomplete and limited due to gestational age. Electronically Signed   By: Tyron Gallon M.D.   On: 06/16/2023 18:02    Current scheduled medications  acetaminophen   1,000 mg Oral Q6H   betamethasone  acetate-betamethasone  sodium phosphate   12 mg Intramuscular Q24H   magnesium   4 g Intravenous Once    I have reviewed the patient's current medications.  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 intrauterine growth restriction (IUGR) -IV magnesium  sulfate, 4 gm bolus, 2g/hr -betamethasone  1st dose given 06/16/23 @ 2334 -continuous fetal monitoring  -Transfer to [facility] in morning -I spoke with Dr. Lattie Poli the on call attending at [facility], who recommended delaying the transfer until the morning. He expressed concerns about our existing care plan that includes Magnesium  sulfate for neuroprotection, particularly if the patient is not expected to deliver within the next week. He noted that if the patient remains stable and there are no urgent complications, the transfer can be postponed until the morning.  I explained our preference to transfer her care while she remains stable and noted that we are not equipped to handle a severe IUGR baby at 27 weeks, currently measuring in the 1st percentile, should an acute event occur.  submandibular abscess  -IV rocephin, unasyn   Stelios Kirby, CNM 06/16/2023 11:42 PM  Arzella Laurence Certified Nurse Midwife [facility]

## 2023-06-17 ENCOUNTER — Inpatient Hospital Stay (HOSPITAL_COMMUNITY)
Admission: AD | Admit: 2023-06-17 | Discharge: 2023-06-18 | DRG: 832 | Disposition: A | Discharge status: Home / Self Care | Attending: Obstetrics and Gynecology | Admitting: Obstetrics and Gynecology

## 2023-06-17 ENCOUNTER — Encounter (HOSPITAL_COMMUNITY): Payer: Self-pay | Admitting: Obstetrics and Gynecology

## 2023-06-17 ENCOUNTER — Inpatient Hospital Stay (HOSPITAL_COMMUNITY)

## 2023-06-17 ENCOUNTER — Other Ambulatory Visit: Payer: Self-pay

## 2023-06-17 ENCOUNTER — Encounter: Payer: Self-pay | Admitting: Obstetrics and Gynecology

## 2023-06-17 DIAGNOSIS — K122 Cellulitis and abscess of mouth: Principal | ICD-10-CM | POA: Diagnosis present

## 2023-06-17 DIAGNOSIS — Z3A27 27 weeks gestation of pregnancy: Secondary | ICD-10-CM

## 2023-06-17 DIAGNOSIS — O99891 Other specified diseases and conditions complicating pregnancy: Secondary | ICD-10-CM | POA: Diagnosis present

## 2023-06-17 DIAGNOSIS — O365920 Maternal care for other known or suspected poor fetal growth, second trimester, not applicable or unspecified: Principal | ICD-10-CM | POA: Diagnosis present

## 2023-06-17 LAB — TYPE AND SCREEN
ABO/RH(D): A POS
Antibody Screen: NEGATIVE

## 2023-06-17 MED ORDER — PRENATAL MULTIVITAMIN CH
1.0000 | ORAL_TABLET | Freq: Every day | ORAL | Status: DC
Start: 2023-06-18 — End: 2023-06-18
  Administered 2023-06-17: 1 via ORAL
  Filled 2023-06-17: qty 1

## 2023-06-17 MED ORDER — CALCIUM CARBONATE ANTACID 500 MG PO CHEW: 2.0000 | CHEWABLE_TABLET | ORAL | Status: DC | PRN

## 2023-06-17 MED ORDER — DOCUSATE SODIUM 100 MG PO CAPS
100.0000 mg | ORAL_CAPSULE | Freq: Every day | ORAL | Status: DC
  Administered 2023-06-17 – 2023-06-18 (×2): 100 mg via ORAL
  Filled 2023-06-17 (×2): qty 1

## 2023-06-17 MED ORDER — LACTATED RINGERS IV SOLN: 125.0000 mL/h | INTRAVENOUS | Status: DC

## 2023-06-17 MED ORDER — BETAMETHASONE SOD PHOS & ACET 6 (3-3) MG/ML IJ SUSP
12.0000 mg | Freq: Once | INTRAMUSCULAR | Status: AC
  Administered 2023-06-18: 12 mg via INTRAMUSCULAR
  Filled 2023-06-17: qty 5

## 2023-06-17 MED ORDER — ACETAMINOPHEN 325 MG PO TABS
650.0000 mg | ORAL_TABLET | ORAL | Status: DC | PRN
  Administered 2023-06-17 (×2): 650 mg via ORAL
  Filled 2023-06-17 (×2): qty 2

## 2023-06-17 MED ORDER — SODIUM CHLORIDE 0.9 % IV SOLN
3.0000 g | Freq: Four times a day (QID) | INTRAVENOUS | Status: DC
  Administered 2023-06-17 – 2023-06-18 (×3): 3 g via INTRAVENOUS
  Filled 2023-06-17 (×3): qty 8

## 2023-06-17 NOTE — Progress Notes (Signed)
 Discussed with Dr Olegario Berlin this am  FGR 1.4% with reassuring FHRT No UAD available at [facility] so management hinges on UAD status Submandibular abscess being treated with Unasyn  IV and pt took soft solid diet this am  Per Dr Olegario Berlin, pt may have UAD performed and possibly discharged home if no reverse on antibiotics of course to follow up as scheduled on 06/24/23 BMZ given x 1 and magnesium  sulfate neonatal prophylaxis nearly completed (started 2330)  Jasmine Perkins  [facility] charge nurse is aware and arrangements being made for transfer  Jasmine Halter, MD 06/17/2023 10:33 AM

## 2023-06-17 NOTE — Discharge Summary (Signed)
 Patient ID: Jasmine Perkins MRN: [id number] DOB/AGE: 10-02-1995 28 y.o.  Admit date: 06/14/2023 Discharge date: 06/17/2023  Admission Diagnoses: Submandibular space infection [K12.2] Pregnancy with 27 completed weeks gestation [Z3A.27]   Discharge Diagnoses: Submandibular space infection [K12.2] Pregnancy with 27 completed weeks gestation [Z3A.27]  Fetal growth restriction   Prenatal Care Site: [facility]  Prenatal Procedures: NST, ultrasound, and CT  Consults: Maternal Fetal Medicine, ENT, [facility] team   Significant Diagnostic Studies:  Results for orders placed or performed during the hospital encounter of 06/14/23 (from the past week)  Group A Strep by PCR ([facility] Only)   Collection Time: 06/14/23  5:48 PM   Specimen: Throat; Sterile Swab  Result Value Ref Range   Group A Strep by PCR NOT DETECTED NOT DETECTED  Basic metabolic panel   Collection Time: 06/14/23  7:38 PM  Result Value Ref Range   Sodium 134 (L) 135 - 145 mmol/L   Potassium 3.8 3.5 - 5.1 mmol/L   Chloride 102 98 - 111 mmol/L   CO2 22 22 - 32 mmol/L   Glucose, Bld 97 70 - 99 mg/dL   BUN 6 6 - 20 mg/dL   Creatinine, Ser 9.28 0.44 - 1.00 mg/dL   Calcium  8.8 (L) 8.9 - 10.3 mg/dL   GFR, Estimated >39 >39 mL/min   Anion gap 10 5 - 15  CBC with Differential   Collection Time: 06/14/23  7:38 PM  Result Value Ref Range   WBC 12.0 (H) 4.0 - 10.5 K/uL   RBC 4.34 3.87 - 5.11 MIL/uL   Hemoglobin 12.6 12.0 - 15.0 g/dL   HCT 62.4 63.9 - 53.9 %   MCV 86.4 80.0 - 100.0 fL   MCH 29.0 26.0 - 34.0 pg   MCHC 33.6 30.0 - 36.0 g/dL   RDW 87.0 88.4 - 84.4 %   Platelets 249 150 - 400 K/uL   nRBC 0.0 0.0 - 0.2 %   Neutrophils Relative % 83 %   Neutro Abs 9.8 (H) 1.7 - 7.7 K/uL   Lymphocytes Relative 10 %   Lymphs Abs 1.2 0.7 - 4.0 K/uL   Monocytes Relative 7 %   Monocytes Absolute 0.9 0.1 - 1.0 K/uL   Eosinophils Relative 0 %   Eosinophils Absolute 0.0 0.0 - 0.5 K/uL   Basophils Relative 0 %    Basophils Absolute 0.0 0.0 - 0.1 K/uL   Immature Granulocytes 0 %   Abs Immature Granulocytes 0.05 0.00 - 0.07 K/uL  Lactic acid, plasma   Collection Time: 06/15/23  1:06 AM  Result Value Ref Range   Lactic Acid, Venous 1.3 0.5 - 1.9 mmol/L  CBC   Collection Time: 06/15/23  5:43 AM  Result Value Ref Range   WBC 12.1 (H) 4.0 - 10.5 K/uL   RBC 3.97 3.87 - 5.11 MIL/uL   Hemoglobin 11.1 (L) 12.0 - 15.0 g/dL   HCT 66.3 (L) 63.9 - 53.9 %   MCV 84.6 80.0 - 100.0 fL   MCH 28.0 26.0 - 34.0 pg   MCHC 33.0 30.0 - 36.0 g/dL   RDW 87.0 88.4 - 84.4 %   Platelets 229 150 - 400 K/uL   nRBC 0.0 0.0 - 0.2 %  CBC   Collection Time: 06/16/23  5:41 AM  Result Value Ref Range   WBC 9.9 4.0 - 10.5 K/uL   RBC 4.01 3.87 - 5.11 MIL/uL   Hemoglobin 11.4 (L) 12.0 - 15.0 g/dL   HCT 65.5 (L) 63.9 - 53.9 %  MCV 85.8 80.0 - 100.0 fL   MCH 28.4 26.0 - 34.0 pg   MCHC 33.1 30.0 - 36.0 g/dL   RDW 86.9 88.4 - 84.4 %   Platelets 233 150 - 400 K/uL   nRBC 0.0 0.0 - 0.2 %    Treatments: IV antibiotics   Hospital Course:  This is a 28 y.o. G3P2 with IUP at [redacted]w[redacted]d admitted for submandibular abscess at [redacted] weeks gestation. She initially presented to the ED with complaints of right lower jaw pain that she thought was due to wisdom tooth cavity. She reported difficulty fully opening her jaw and difficulty with swallowing. She denied fevers but did report chills. Reported difficulty with swallowing liquids and solid food and overall poor PO intake. Swelling has improved since starting IV antibiotics and she is now tolerating clear liquids and some PO intake with soft diet. She's remained afebrile and WBC have decreased from 12.1 > 9.9.  Her current pregnancy is complicated by fetal growth restriction. She is due for UAD evaluation with MFM. Growth US  was completed on 06/16/2023 and showed severe FGR with EFW at 1.4 % tile. Fetal monitoring has been reassuring and AFI was WNL. Magnesium  sulfate was given for 12 hours for  neuroprotection and first course of steroids given. Will transfer to [facility] in [place] for better evaluation with MFM and UAD studies as we are unable to complete these at [facility]. Per [facility], may continue on Unasyn  IV while inpatient and then transition to Augmentin  PO for 7-10 days. Receiving provider is Dr. Jayne.    Discharge Physical Exam:  BP 107/70   Pulse 88   Temp 98 F (36.7 C) (Oral)   Resp 16   Ht 5' 7 (1.702 m)   Wt 81.6 kg   LMP 12/06/2022   SpO2 97%   BMI 28.19 kg/m   General: NAD CV: RRR Pulm: CTABL, nl effort ABD: s/nd/nt, gravid DVT Evaluation: LE non-ttp, no evidence of DVT on exam.  SVE: deferred, exam not indicated  Fetal monitoring: Baseline: 140 bpm, Variability: moderate, Accelerations: present 10 x 10, and Decelerations: Absent TOCO: none  Category I   Discharge Condition: Stable  Disposition: Discharge disposition: [facility] Not Defined        Allergies as of 06/17/2023   No Known Allergies      Medication List     TAKE these medications    BC HEADACHE POWDER PO Take 1 packet by mouth every 8 (eight) hours as needed.   IRON PO Take 1 tablet by mouth daily.   ondansetron  4 MG tablet Commonly known as: Zofran  Take 1 tablet (4 mg total) by mouth daily as needed.   Prenatal 19 tablet Take 1 tablet by mouth daily.         Signed:  Therisa CHRISTELLA Pillow 06/17/2023 12:25 PM ----- Therisa Pillow, CNM Certified Nurse Midwife [facility]

## 2023-06-17 NOTE — H&P (Signed)
 FACULTY PRACTICE ANTEPARTUM ADMISSION HISTORY AND PHYSICAL NOTE  History of Present Illness: Jasmine Perkins is a 28 y.o. [id number] at [redacted]w[redacted]d admitted with FGR (1.4%) and submandibular space infection  Pt admitted at [facility] from 4/15-4/18 for submandibular space infection. She was started on unasyn  and has had significant improvement of her symptoms.   She has fetal growth restriction and [facility] is not able to perform dopplers. She received magnesium  x 12h for neuroprotection and BMZ#1. She was transferred here for UAD to help determine next steps in her management.   On arrival, pt reports she is doing well and confirms the above history. Reports feeling much better and being able to tolerate PO intake.   Denies ctx, VB, LOF. +FM.  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 No pertinent past medical history.  Past Surgical History:  Procedure Laterality Date   submental lymph node biopsy N/A    OB History  Gravida Para Term Preterm AB Living  4 2 1 1 1 2   SAB IAB Ectopic Multiple Live Births  1    2    # Outcome Date GA Lbr Len/2nd Weight Sex Type Anes PTL Lv  4 Current           3 Term 06/13/20 [redacted]w[redacted]d  2637 g M Vag-Spont   LIV     Complications: Pregnancy affected by fetal growth restriction  2 Preterm 05/17/19 [redacted]w[redacted]d  2693 g M Vag-Spont   LIV  1 SAB 2020     SAB       Social History   Socioeconomic History   Marital status: Single    Spouse name: Not on file   Number of children: Not on file   Years of education: Not on file   Highest education level:  Not on file  Occupational History   Not on file  Tobacco Use   Smoking status: Never   Smokeless tobacco: Never  Vaping Use   Vaping status: Never Used  Substance and Sexual Activity   Alcohol use: No    Comment: rare   Drug use: No   Sexual activity: Yes    Partners: Male    Birth control/protection: Injection  Other Topics Concern   Not on file  Social History Narrative   Not on file   Social Drivers of Health   Financial Resource Strain: Low Risk  (04/13/2023)   Received from [facility]   Overall Financial Resource Strain (CARDIA)    Difficulty of Paying Living Expenses: Not very hard  Food Insecurity: No Food Insecurity (06/17/2023)   Hunger Vital Sign    Worried About Running Out of Food in the Last Year: Never true    Ran Out of Food in the Last Year: Never true  Recent Concern: Food Insecurity - Food Insecurity Present (04/13/2023)   Received from [facility]   Hunger Vital Sign    Worried About Running Out of Food in the Last Year: Sometimes true    Ran Out of Food in the Last Year: Never true  Transportation Needs: No Transportation Needs (06/17/2023)   PRAPARE -  Administrator, Civil Service (Medical): No    Lack of Transportation (Non-Medical): No  Physical Activity: Not on file  Stress: Not on file  Social Connections: Unknown (07/14/2021)   [facility]   Social Network    Social Network: Not on file    Family History  Problem Relation Age of Onset   Healthy Mother    Hypertension Father    Heart attack Father    Breast cancer Maternal Grandmother    Healthy Sister    Healthy Brother    Healthy Sister     No Known Allergies  Medications Prior to Admission  Medication Sig Dispense Refill Last Dose/Taking   Prenatal Vit-DSS-Fe Fum-FA (PRENATAL 19) tablet Take 1 tablet by mouth daily.   06/16/2023   Aspirin-Salicylamide-Caffeine (BC HEADACHE POWDER PO) Take 1 packet by mouth every 8  (eight) hours as needed.      Ferrous Sulfate (IRON PO) Take 1 tablet by mouth daily.      ondansetron  (ZOFRAN ) 4 MG tablet Take 1 tablet (4 mg total) by mouth daily as needed. 30 tablet 1     Review of Systems - Negative except as noted in HPI  Vitals:  BP (!) 93/52 (BP Location: Right Arm)   Pulse 82   Temp 98.1 F (36.7 C) (Oral)   Resp 18   Ht 5\' 7"  (1.702 m)   LMP 12/06/2022   SpO2 99%   BMI 28.19 kg/m  Physical Examination: CONSTITUTIONAL: Well-developed, well-nourished female in no acute distress.  CARDIOVASCULAR: Normal heart rate noted RESPIRATORY: Effort normal, no problems with respiration noted ABDOMEN: Soft, nontender, nondistended, gravid.  Cervix: deferred Membranes:intact Fetal Monitoring:Baseline: 145 bpm, moderate variability, no accels, occasional variables Tocometer: Flat  Labs:  Results for orders placed or performed during the hospital encounter of 06/17/23 (from the past 24 hours)  Type and screen [facility]   Collection Time: 06/17/23  4:13 PM  Result Value Ref Range   ABO/RH(D) A POS    Antibody Screen NEG    Sample Expiration      06/20/2023,2359 Performed at [facility], [address] [place], [place] [address]    Imaging Studies: US  MFM UA CORD DOPPLER Result Date: 06/17/2023 ----------------------------------------------------------------------  OBSTETRICS REPORT                       (Signed Final 06/17/2023 05:44 pm) ---------------------------------------------------------------------- Patient Info  ID #:       [id number]                          D.O.B.:  07/14/1995 (28 yrs)(F)  Name:       Jasmine Perkins                   Visit Date: 06/17/2023 04:12 pm ---------------------------------------------------------------------- Performed By  Attending:        Penney Bowling DO       Ref. Address:     [address]  Performed By:     Lonny Robertson       [facility]  Referred By:      Izell Marsh MD ---------------------------------------------------------------------- Orders  #  Description                           Code        Ordered By  1  US  MFM UA CORD DOPPLER                [phone]    [facility] Galo Sayed ----------------------------------------------------------------------  #  Order #                     Accession #                Episode #  1  [id number]                   [id number]                 [id number] ---------------------------------------------------------------------- Indications  [redacted] weeks gestation of pregnancy                Z3A.27  Maternal care for known or suspected poor      O36.5920  fetal growth, second trimester, single or  unspecified fetus IUGR ---------------------------------------------------------------------- Fetal Evaluation  Num Of Fetuses:         1  Fetal Heart Rate(bpm):  136  Cardiac Activity:       Observed  Presentation:           Samuel Crock breech  Placenta:               Posterior  P. Cord Insertion:      Not well visualized  Amniotic Fluid  AFI FV:      Within normal limits  AFI Sum(cm)     %Tile       Largest Pocket(cm)  8.6             3.1         3.7  RUQ(cm)       RLQ(cm)       LUQ(cm)  2.3           3.7           2.6 ---------------------------------------------------------------------- OB History  Gravidity:    4         Term:   1        Prem:   1        SAB:   1  Living:       2 ---------------------------------------------------------------------- Gestational Age  Clinical EDD:  27w 4d                                        EDD:   09/12/23  Best:          27w 4d     Det. By:  Clinical EDD             EDD:   09/12/23 ---------------------------------------------------------------------- Anatomy  Ventricles:            Not well visualized    Stomach:                Appears normal, left  sided  Heart:                  Not well visualized    Kidneys:                Not well visualized  Diaphragm:             Appears normal         Bladder:                Appears normal ---------------------------------------------------------------------- Doppler - Fetal Vessels  Umbilical Artery   S/D     %tile      RI    %tile      PI    %tile     PSV    ADFV    RDFV                                                     (cm/s)    3.5       72     0.7       68     1.2       82       27      No      No ---------------------------------------------------------------------- Comments  Sonographic findings  Single intrauterine pregnancy at 27w 4d.  Fetal cardiac activity:  Observed and appears normal.  Presentation: Samuel Crock breech.  Interval fetal anatomy appears normal. There is mild relative  cardiac enlargement likely due to the fetal growth restriction.  Amniotic fluid: Within normal limits.  MVP: 3.7 cm.  Placenta: Posterior.  Umbilical artery dopplers findings:  -S/D:3.5 which are normal at this gestational age.  -Absent end-diastolic flow: No.  -Reversed end-diastolic flow:  No.  Recommendations  - While in patient, UA dopplers can be done weekly. If the  patient is dicharged, she has follow up on 06/24/23.  - Continue TID NST tracings while admitted  - S/p BMZ  - No need for magnesium  unless there is concern for delivery  There are limitations of prenatal ultrasound such as the  inability to detect certain abnormalities due to poor  visualization. Various factors such as fetal position,  gestational age and maternal body habitus may increase the  difficulty in visualizing the fetal anatomy. ----------------------------------------------------------------------                  Penney Bowling, DO Electronically Signed Final Report   06/17/2023 05:44 pm ----------------------------------------------------------------------   US  OB Comp + 14 Wk Result Date: 06/16/2023 CLINICAL DATA:  IUGR intrauterine growth restriction EXAM: OBSTETRICAL  ULTRASOUND >14 WKS FINDINGS: Number of Fetuses: 1 Heart Rate:  144 bpm Movement: Yes Presentation: Cephalic/transverse Previa: No Placental Location: Posterior fundal Amniotic Fluid (Subjective): Normal Amniotic Fluid (Objective): Vertical pocket = 5.3cm FETAL BIOMETRY BPD: 6.2cm 25w 0d HC:   23.1cm 25w d AC:   20.2cm 24w 6d FL:   4.9cm 26w is 3d Current Mean GA: 25w 4d US  EDC: 09/25/2023 Assigned GA:  27w 3d Assigned EDC: 09/12/2023 Estimated Fetal Weight:  107g 1.4%ile FETAL ANATOMY Lateral Ventricles: Appears normal Thalami/CSP: Appears normal Posterior Fossa:  Appears normal Nuchal Region: Appears normal   NFT= 5.1 Upper Lip: Not well visualized Spine: Limited views appear normal 4 Chamber Heart on Left: Appears normal LVOT: Not visualized RVOT: Not visualized Stomach on Left:  Appears normal 3 Vessel Cord: Appears normal Cord Insertion site: Limited views appear normal Kidneys: Appears normal Bladder: Appears normal Extremities: Limited views appear normal Sex: Female Technically difficult due to: Fetal position Maternal Findings: Cervix:  3.2 cm transabdominal IMPRESSION: 1. Single live intrauterine gestation measuring 25 weeks 4 days on today's ultrasound. Assigned gestational age is 27 weeks and 3 days. Findings are worrisome for intrauterine growth restriction, likely symmetric. 2. Estimated fetal weight 1.4%. 3. Fetal anatomic survey is incomplete and limited due to gestational age. Electronically Signed   By: Tyron Gallon M.D.   On: 06/16/2023 18:02   CT Soft Tissue Neck W Contrast Result Date: 06/14/2023 CLINICAL DATA:  Epiglottitis or tonsillitis suspected Peritonsilar abscess + submandibular space fullness EXAM: CT NECK WITH CONTRAST TECHNIQUE: Multidetector CT imaging of the neck was performed using the standard protocol following the bolus administration of intravenous contrast. RADIATION DOSE REDUCTION: This exam was performed according to the departmental dose-optimization program which includes  automated exposure control, adjustment of the mA and/or kV according to patient size and/or use of iterative reconstruction technique. CONTRAST:  75mL OMNIPAQUE  IOHEXOL  300 MG/ML  SOLN COMPARISON:  None Available. FINDINGS: Pharynx/larynx and surrounding soft tissues: Edema involving the right submandibular space with extension to involve the right floor mouth and right masticator space. No discrete, drainable fluid collection Salivary glands: Edema surrounding the right submandibular gland but the gland itself appears within normal limits. Normal appearance of the parotid glands. No calculi. No ductal dilation Thyroid: Subcentimeter thyroid nodules do not require further imaging follow-up (ref: J Am Coll Radiol. 2015 Feb;12(2): 143-50). Lymph nodes: Mildly prominent right submandibular lymph nodes. Area of submental nodularity seen on remote CT neck is likely still present and similar. Vascular: Not well evaluated due to non arterial timing. Limited intracranial: Negative. Visualized orbits: Negative. Mastoids and visualized paranasal sinuses: Clear. Skeleton: No acute or aggressive process. Upper chest: Visualized lung apices are clear. IMPRESSION: 1. Edema involving the right submandibular space with extension to involve the right floor mouth and right masticator space, most likely infectious cellulitis/phlegmon. No discrete, drainable fluid collection. 2. Mildly irregular soft tissue nodularity in the submental space is likely similar in comparison to remote 2015 prior. Electronically Signed   By: Stevenson Elbe M.D.   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   Intrauterine growth restriction affecting care of mother, unspecified trimester, not applicable or unspecified fetus 05/17/2023   Choroid plexus cysts, fetal, affecting care of mother,  antepartum 04/13/2023   Supervision of high risk pregnancy in second trimester 04/12/2023   Toxoplasmosis 06/11/2013   Admit to Antenatal Second dose of betamethasone  ordered UAD ordered and normal, but given nonreative NST plan for continued admission overnight TID NSTs while admitted IV Unasyn  ordered with plan to transition to PO augmentin  x 7-10d on discharge Routine antenatal care  Loralyn Rochester, MD Obstetrician & Gynecologist, Faculty Practice Faculty Practice, [facility]

## 2023-06-17 NOTE — Progress Notes (Signed)
 PROGRESS NOTE    Jasmine Perkins   FMW:[id number] DOB: Jul 15, 1995  DOA: 06/14/2023 Date of Service: 06/17/23 which is hospital day 1  PCP: Chauvin, Robert, [facility] course / significant events:   HPI:  Jasmine Perkins is a 28 y.o. female with past medical history of At [redacted] weeks gestation being consulted on for submandibular space infection.  She presented to the ED with pain in the right lower jaw, with difficulty opening her mouth, swallowing and managing her secretions.   Of note, hx removal of submandibular lymph node / salivary gland years ago likely is impeding lymphatic drainage in this area   04/15: to ED. CT showed no organized fluid collection, ENT advised no ENT follow-up needed. Admitted to [facility] w/ hospitalist consult. 04/16: improving, swallowing okay, opens mouth some but hurt if goes too far, may advance diet as requested but avoid chewing hard foods. Advise remain on IV abx at least 48 h, will recheck tomorrow  04/17: WBC normalized, swelling improved but still present. Pt reports pain w/ opening jaw is about same as yesterday 04/18: significant improvement. OK to continue Unasyn  while inpatient, ok to switch to amoxicillin  875 mg po q 12 h, or Keflex 500 mg po q 6 h to complete total 7-10 days course (ending 04/22 or 04/25)   CT soft tissue neck showing the following: IMPRESSION: 1. Edema involving the right submandibular space with extension to involve the right floor mouth and right masticator space, most likely infectious cellulitis/phlegmon. No discrete, drainable fluid collection. 2. Mildly irregular soft tissue nodularity in the submental space is likely similar in comparison to remote 2015 prior.        ASSESSMENT & PLAN:   Submandibular space infection Of note, hx removal of submandibular lymph node / salivary gland years ago likely is impeding lymphatic drainage in this area  Continue Unasyn  at least 48h (which has been completed), ok to leave on  this while inpatient ok to switch to amoxicillin  875 mg po q 12 h, or Keflex 500 mg po q 6 h to complete total 7-10 days course (ending 04/22 or 04/25) Will hold off further Decadron , she has been improving without it  Reimage if worse No ENT need at this time, no operable mass/abscess  Pain control Soft diet as desired and advance as tolerated    Pregnancy with 27 completed weeks gestation Her current pregnancy is complicated by fetal growth restriction that is currently followed by Maternal Fetal Medicine.  Management per OB    HOSPITALIST TEAM WILL SIGN OFF AT THIS POINT 06/17/23 please do not hesitate to contact us  as needed.                Subjective / Brief ROS:  Patient reports pain is controlled, swelling is much better today  Denies CP/SOB.  Pain controlled.  Denies new weakness.  Tolerating diet if liquids/soft.  Reports no concerns w/ urination/defecation.       Objective Findings:  Vitals:   06/17/23 1055 06/17/23 1100 06/17/23 1157 06/17/23 1246  BP:    107/70  Pulse:    81  Resp:    16  Temp:   98 F (36.7 C) 98 F (36.7 C)  TempSrc:   Oral Oral  SpO2: 100% 97%    Weight:      Height:        Intake/Output Summary (Last 24 hours) at 06/17/2023 1258 Last data filed at 06/17/2023 1000 Gross per 24 hour  Intake 2330.65 ml  Output 1900 ml  Net 430.65 ml   Filed Weights   06/14/23 1743  Weight: 81.6 kg    Examination:  Physical Exam HENT:     Head:     Jaw: Swelling (on R madible area appears improved from yesterday) and pain on movement present.     Mouth/Throat:     Mouth: Mucous membranes are moist.     Tonsils: No tonsillar exudate.  Musculoskeletal:     Cervical back: Normal range of motion.  Lymphadenopathy:     Cervical: No cervical adenopathy.          Scheduled Medications:   acetaminophen   1,000 mg Oral Q6H   betamethasone  acetate-betamethasone  sodium phosphate   12 mg Intramuscular Q24H    Continuous  Infusions:  ampicillin -sulbactam (UNASYN ) IV 3 g (06/17/23 1051)   lactated ringers  Stopped (06/17/23 1008)   lactated ringers  10 mL/hr at 06/16/23 2332   magnesium  sulfate 2 g/hr (06/17/23 0726)    PRN Medications:  morphine  injection, ondansetron  (ZOFRAN ) IV, terbutaline   Antimicrobials from admission:  Anti-infectives (From admission, onward)    Start     Dose/Rate Route Frequency Ordered Stop   06/15/23 0400  Ampicillin -Sulbactam (UNASYN ) 3 g in sodium chloride  0.9 % 100 mL IVPB        3 g 200 mL/hr over 30 Minutes Intravenous Every 6 hours 06/15/23 0248     06/14/23 2045  Ampicillin -Sulbactam (UNASYN ) 3 g in sodium chloride  0.9 % 100 mL IVPB        3 g 200 mL/hr over 30 Minutes Intravenous  Once 06/14/23 2043 06/14/23 2308           Data Reviewed:  I have personally reviewed the following...  CBC: Recent Labs  Lab 06/14/23 1938 06/15/23 0543 06/16/23 0541  WBC 12.0* 12.1* 9.9  NEUTROABS 9.8*  --   --   HGB 12.6 11.1* 11.4*  HCT 37.5 33.6* 34.4*  MCV 86.4 84.6 85.8  PLT 249 229 233   Basic Metabolic Panel: Recent Labs  Lab 06/14/23 1938  NA 134*  K 3.8  CL 102  CO2 22  GLUCOSE 97  BUN 6  CREATININE 0.71  CALCIUM  8.8*   GFR: Estimated Creatinine Clearance: 117.1 mL/min (by C-G formula based on SCr of 0.71 mg/dL). Liver Function Tests: No results for input(s): AST, ALT, ALKPHOS, BILITOT, PROT, ALBUMIN in the last 168 hours. No results for input(s): LIPASE, AMYLASE in the last 168 hours. No results for input(s): AMMONIA  in the last 168 hours. Coagulation Profile: No results for input(s): INR, PROTIME in the last 168 hours. Cardiac Enzymes: No results for input(s): CKTOTAL, CKMB, CKMBINDEX, TROPONINI in the last 168 hours. BNP (last 3 results) No results for input(s): PROBNP in the last 8760 hours. HbA1C: No results for input(s): HGBA1C in the last 72 hours. CBG: No results for input(s): GLUCAP in the last 168  hours. Lipid Profile: No results for input(s): CHOL, HDL, LDLCALC, TRIG, CHOLHDL, LDLDIRECT in the last 72 hours. Thyroid Function Tests: No results for input(s): TSH, T4TOTAL, FREET4, T3FREE, THYROIDAB in the last 72 hours. Anemia Panel: No results for input(s): VITAMINB12, FOLATE, FERRITIN, TIBC, IRON, RETICCTPCT in the last 72 hours. Most Recent Urinalysis On File:  No results found for: COLORURINE, APPEARANCEUR, LABSPEC, PHURINE, GLUCOSEU, HGBUR, BILIRUBINUR, KETONESUR, PROTEINUR, UROBILINOGEN, NITRITE, LEUKOCYTESUR Sepsis Labs: @LABRCNTIP (procalcitonin:4,lacticidven:4) Microbiology: Recent Results (from the past 240 hours)  Group A Strep by PCR ([facility] Only)     Status: None   Collection  Time: 06/14/23  5:48 PM   Specimen: Throat; Sterile Swab  Result Value Ref Range Status   Group A Strep by PCR NOT DETECTED NOT DETECTED Final    Comment: Performed at [facility], [address], [place], [place] [address]      Radiology Studies last 3 days: US  OB Comp + 14 Wk Result Date: 06/16/2023 CLINICAL DATA:  IUGR intrauterine growth restriction EXAM: OBSTETRICAL ULTRASOUND >14 WKS FINDINGS: Number of Fetuses: 1 Heart Rate:  144 bpm Movement: Yes Presentation: Cephalic/transverse Previa: No Placental Location: Posterior fundal Amniotic Fluid (Subjective): Normal Amniotic Fluid (Objective): Vertical pocket = 5.3cm FETAL BIOMETRY BPD: 6.2cm 25w 0d HC:   23.1cm 25w d AC:   20.2cm 24w 6d FL:   4.9cm 26w is 3d Current Mean GA: 25w 4d US  EDC: 09/25/2023 Assigned GA:  27w 3d Assigned EDC: 09/12/2023 Estimated Fetal Weight:  107g 1.4%ile FETAL ANATOMY Lateral Ventricles: Appears normal Thalami/CSP: Appears normal Posterior Fossa:  Appears normal Nuchal Region: Appears normal   NFT= 5.1 Upper Lip: Not well visualized Spine: Limited views appear normal 4 Chamber Heart on Left: Appears normal LVOT: Not visualized RVOT: Not visualized Stomach  on Left: Appears normal 3 Vessel Cord: Appears normal Cord Insertion site: Limited views appear normal Kidneys: Appears normal Bladder: Appears normal Extremities: Limited views appear normal Sex: Female Technically difficult due to: Fetal position Maternal Findings: Cervix:  3.2 cm transabdominal IMPRESSION: 1. Single live intrauterine gestation measuring 25 weeks 4 days on today's ultrasound. Assigned gestational age is 27 weeks and 3 days. Findings are worrisome for intrauterine growth restriction, likely symmetric. 2. Estimated fetal weight 1.4%. 3. Fetal anatomic survey is incomplete and limited due to gestational age. Electronically Signed   By: Greig Pique M.D.   On: 06/16/2023 18:02   CT Soft Tissue Neck W Contrast Result Date: 06/14/2023 CLINICAL DATA:  Epiglottitis or tonsillitis suspected Peritonsilar abscess + submandibular space fullness EXAM: CT NECK WITH CONTRAST TECHNIQUE: Multidetector CT imaging of the neck was performed using the standard protocol following the bolus administration of intravenous contrast. RADIATION DOSE REDUCTION: This exam was performed according to the departmental dose-optimization program which includes automated exposure control, adjustment of the mA and/or kV according to patient size and/or use of iterative reconstruction technique. CONTRAST:  75mL OMNIPAQUE  IOHEXOL  300 MG/ML  SOLN COMPARISON:  None Available. FINDINGS: Pharynx/larynx and surrounding soft tissues: Edema involving the right submandibular space with extension to involve the right floor mouth and right masticator space. No discrete, drainable fluid collection Salivary glands: Edema surrounding the right submandibular gland but the gland itself appears within normal limits. Normal appearance of the parotid glands. No calculi. No ductal dilation Thyroid: Subcentimeter thyroid nodules do not require further imaging follow-up (ref: J Am Coll Radiol. 2015 Feb;12(2): 143-50). Lymph nodes: Mildly prominent  right submandibular lymph nodes. Area of submental nodularity seen on remote CT neck is likely still present and similar. Vascular: Not well evaluated due to non arterial timing. Limited intracranial: Negative. Visualized orbits: Negative. Mastoids and visualized paranasal sinuses: Clear. Skeleton: No acute or aggressive process. Upper chest: Visualized lung apices are clear. IMPRESSION: 1. Edema involving the right submandibular space with extension to involve the right floor mouth and right masticator space, most likely infectious cellulitis/phlegmon. No discrete, drainable fluid collection. 2. Mildly irregular soft tissue nodularity in the submental space is likely similar in comparison to remote 2015 prior. Electronically Signed   By: Gilmore GORMAN Molt M.D.   On: 06/14/2023 22:45  Damico Partin, DO [facility] 06/17/2023, 12:58 PM    Dictation software may have been used to generate the above note. Typos may occur and escape review in typed/dictated notes. Please contact Dr Marsa directly for clarity if needed.  Staff may message me via secure chat in Epic  but this may not receive an immediate response,  please page me for urgent matters!  If 7PM-7AM, please contact night coverage [email or web address]

## 2023-06-17 NOTE — Plan of Care (Signed)

## 2023-06-17 NOTE — Progress Notes (Signed)
 MFM Plan of Care  Plan for patient to transfer to [place] due to severe FGR and no UA doppler availability. NST has been reassuring per OB team. If the UA dopplers are normal and the remainder of the clinical course is stable and progressing then discharge will depend on the patients non-obstetric concerns and the decision to discharge will be based on the medicine team. If the UA dopplers are concerning then we will devise an appropriate plan of care.

## 2023-06-18 MED ORDER — AMOXICILLIN-POT CLAVULANATE 875-125 MG PO TABS
1.0000 | ORAL_TABLET | Freq: Two times a day (BID) | ORAL | 1 refills | Status: DC
Start: 2023-06-18 — End: 2023-08-24

## 2023-06-18 NOTE — Discharge Summary (Signed)
 Patient ID: Jasmine Perkins MRN: [id number] DOB/AGE: Jun 23, 1995 28 y.o.  Admit date: 06/17/2023 Discharge date: 06/18/2023  Admission Diagnoses:[redacted]w[redacted]d, submandibular abscess IUGR Discharge Diagnoses: same  Prenatal Procedures: ultrasound  Consults:  Maternal Fetal Medicine  Hospital Course:  This is a 28 y.o. [id number] with IUP at [redacted]w[redacted]d admitted for fetal assessment due to FGR and treatment for submandibular abscess, transferred from [facility]. Prenatal care was with [facility] and followed by MFM. She was admitted at [facility] and received Unasyn  for oral abscess but was transferred so UA dopplers could be done.  Dopplers were normal and repeat was scheduled for 4/25. She was afebrile and was ready to change to oral antibiotics  She was deemed stable for discharge to home with outpatient follow up.  Discharge Exam: Temp:  [98 F (36.7 C)-98.2 F (36.8 C)] 98.2 F (36.8 C) (04/19 0329) Pulse Rate:  [72-89] 80 (04/19 0329) Resp:  [16-18] 18 (04/19 0329) BP: (93-118)/(52-73) 102/63 (04/19 0329) SpO2:  [97 %-100 %] 99 % (04/19 0329) Physical Examination: CONSTITUTIONAL: Well-developed, well-nourished female in no acute distress.  HENT:  Normocephalic, atraumatic, External right and left ear normal. Oropharynx is clear and moist EYES: Conjunctivae and EOM are normal. Pupils are equal, round, and reactive to light. No scleral icterus.  NECK: Normal range of motion, supple, no masses SKIN: Skin is warm and dry. No rash noted. Not diaphoretic. No erythema. No pallor. NEUROLGIC: Alert and oriented to person, place, and time. Normal reflexes, muscle tone coordination. No cranial nerve deficit noted. PSYCHIATRIC: Normal mood and affect. Normal behavior. Normal judgment and thought content. CARDIOVASCULAR: Normal heart rate noted, regular rhythm RESPIRATORY: Effort normal, no problems with respiration noted MUSCULOSKELETAL: Normal range of motion. No edema and no tenderness. ABDOMEN: Soft, nontender,  nondistended, gravid. CERVIX:    Flowsheet Row Most Recent Value  Fetal Heart Rate A   Mode External  [removed] filed at 06/17/2023 2031  Baseline Rate (A) 145 bpm filed at 06/17/2023 2031  Variability <5 BPM, 6-25 BPM filed at 06/17/2023 2031  Accelerations 10 x 10 filed at 06/17/2023 2031  Decelerations None filed at 06/17/2023 2031     Significant Diagnostic Studies:  ----------------------------------------------------------------------  OBSTETRICS REPORT                       (Signed Final 06/17/2023 05:44 pm) ---------------------------------------------------------------------- Patient Info    ID #:       [id number]                          D.O.B.:  10/28/1995 (28 yrs)(F)  Name:       [name]                   Visit Date: 06/17/2023 04:12 pm ---------------------------------------------------------------------- Performed By    Attending:        Penney Bowling DO       Ref. Address:     [address]  Performed By:     Lonny Robertson       [facility]  Referred  By:      Izell Marsh MD ---------------------------------------------------------------------- Orders    #  Description                           Code        Ordered By  1  US  MFM UA CORD DOPPLER                76820.02    [facility] ----------------------------------------------------------------------    #  Order #                     Accession #                Episode #  1  [id number]                   [id number]                 [id number] ---------------------------------------------------------------------- Indications    [redacted] weeks gestation of pregnancy                Z3A.27  Maternal care for known or suspected poor      O36.5920  fetal growth, second trimester, single or  unspecified fetus IUGR ---------------------------------------------------------------------- Fetal  Evaluation    Num Of Fetuses:         1  Fetal Heart Rate(bpm):  136  Cardiac Activity:       Observed  Presentation:           Samuel Crock breech  Placenta:               Posterior  P. Cord Insertion:      Not well visualized    Amniotic Fluid  AFI FV:      Within normal limits    AFI Sum(cm)     %Tile       Largest Pocket(cm)  8.6             3.1         3.7    RUQ(cm)       RLQ(cm)       LUQ(cm)  2.3           3.7           2.6 ---------------------------------------------------------------------- OB History    Gravidity:    4         Term:   1        Prem:   1        SAB:   1  Living:       2 ---------------------------------------------------------------------- Gestational Age    Clinical EDD:  27w 4d                                        EDD:   09/12/23  Best:          27w 4d     Det. By:  Clinical EDD             EDD:   09/12/23 ---------------------------------------------------------------------- Anatomy    Ventricles:            Not well visualized    Stomach:  Appears normal, left                                                                        sided  Heart:                 Not well visualized    Kidneys:                Not well visualized  Diaphragm:             Appears normal         Bladder:                Appears normal ---------------------------------------------------------------------- Doppler - Fetal Vessels    Umbilical Artery   S/D     %tile      RI    %tile      PI    %tile     PSV    ADFV    RDFV                                                     (cm/s)    3.5       72     0.7       68     1.2       82       27      No      No   ---------------------------------------------------------------------- Comments    Sonographic findings  Single intrauterine pregnancy at 27w 4d.  Fetal cardiac activity:  Observed and appears normal.  Presentation: Samuel Crock breech.  Interval fetal anatomy appears normal. There is mild relative  cardiac  enlargement likely due to the fetal growth restriction.  Amniotic fluid: Within normal limits.  MVP: 3.7 cm.  Placenta: Posterior.  Umbilical artery dopplers findings:  -S/D:3.5 which are normal at this gestational age.  -Absent end-diastolic flow: No.  -Reversed end-diastolic flow:  No.    Recommendations  - While in patient, UA dopplers can be done weekly. If the  patient is dicharged, she has follow up on 06/24/23.  - Continue TID NST tracings while admitted  - S/p BMZ  - No need for magnesium  unless there is concern for delivery    There are limitations of prenatal ultrasound such as the  inability to detect certain abnormalities due to poor  visualization. Various factors such as fetal position,  gestational age and maternal body habitus may increase the  difficulty in visualizing the fetal anatomy. ----------------------------------------------------------------------                  Penney Bowling, DO Electronically Signed Final Report   06/17/2023 05:44 pm ----------------------------------------------------------------------   Results for orders placed or performed during the hospital encounter of 06/17/23 (from the past week)  Type and screen [facility]   Collection Time: 06/17/23  4:13 PM  Result Value Ref Range   ABO/RH(D) A POS    Antibody Screen NEG    Sample Expiration      06/20/2023,2359 Performed at [facility], [address] [place], [place] [address]   Results for orders placed or performed during the hospital encounter of 06/14/23 (from the past week)  Group A Strep by PCR ([facility] Only)   Collection Time: 06/14/23  5:48 PM   Specimen: Throat; Sterile Swab  Result Value Ref Range   Group A Strep by PCR NOT DETECTED NOT DETECTED  Basic metabolic panel   Collection Time: 06/14/23  7:38 PM  Result Value Ref Range   Sodium 134 (L) 135 - 145 mmol/L   Potassium 3.8 3.5 - 5.1 mmol/L   Chloride 102 98 - 111 mmol/L   CO2 22 22 - 32  mmol/L   Glucose, Bld 97 70 - 99 mg/dL   BUN 6 6 - 20 mg/dL   Creatinine, Ser 6.04 0.44 - 1.00 mg/dL   Calcium  8.8 (L) 8.9 - 10.3 mg/dL   GFR, Estimated >54 >09 mL/min   Anion gap 10 5 - 15  CBC with Differential   Collection Time: 06/14/23  7:38 PM  Result Value Ref Range   WBC 12.0 (H) 4.0 - 10.5 K/uL   RBC 4.34 3.87 - 5.11 MIL/uL   Hemoglobin 12.6 12.0 - 15.0 g/dL   HCT 81.1 91.4 - 78.2 %   MCV 86.4 80.0 - 100.0 fL   MCH 29.0 26.0 - 34.0 pg   MCHC 33.6 30.0 - 36.0 g/dL   RDW 95.6 21.3 - 08.6 %   Platelets 249 150 - 400 K/uL   nRBC 0.0 0.0 - 0.2 %   Neutrophils Relative % 83 %   Neutro Abs 9.8 (H) 1.7 - 7.7 K/uL   Lymphocytes Relative 10 %   Lymphs Abs 1.2 0.7 - 4.0 K/uL   Monocytes Relative 7 %   Monocytes Absolute 0.9 0.1 - 1.0 K/uL   Eosinophils Relative 0 %   Eosinophils Absolute 0.0 0.0 - 0.5 K/uL   Basophils Relative 0 %   Basophils Absolute 0.0 0.0 - 0.1 K/uL   Immature Granulocytes 0 %   Abs Immature Granulocytes 0.05 0.00 - 0.07 K/uL  Lactic acid, plasma   Collection Time: 06/15/23  1:06 AM  Result Value Ref Range   Lactic Acid, Venous 1.3 0.5 - 1.9 mmol/L  CBC   Collection Time: 06/15/23  5:43 AM  Result Value Ref Range   WBC 12.1 (H) 4.0 - 10.5 K/uL   RBC 3.97 3.87 - 5.11 MIL/uL   Hemoglobin 11.1 (L) 12.0 - 15.0 g/dL   HCT 57.8 (L) 46.9 - 62.9 %   MCV 84.6 80.0 - 100.0 fL   MCH 28.0 26.0 - 34.0 pg   MCHC 33.0 30.0 - 36.0 g/dL   RDW 52.8 41.3 - 24.4 %   Platelets 229 150 - 400 K/uL   nRBC 0.0 0.0 - 0.2 %  CBC   Collection Time: 06/16/23  5:41 AM  Result Value Ref Range   WBC 9.9 4.0 - 10.5 K/uL   RBC 4.01 3.87 - 5.11 MIL/uL   Hemoglobin 11.4 (L) 12.0 - 15.0 g/dL   HCT 01.0 (L) 27.2 - 53.6 %   MCV 85.8 80.0 - 100.0 fL   MCH 28.4 26.0 - 34.0 pg   MCHC 33.1 30.0 - 36.0 g/dL   RDW 64.4 03.4 - 74.2 %   Platelets 233 150 - 400 K/uL   nRBC 0.0 0.0 - 0.2 %    Discharge Condition: Stable  Disposition: Discharge disposition: 01-Home or Self  Care  Discharge Instructions     Discharge patient   Complete by: As directed    As per PACU criteria   Discharge disposition: 01-Home or Self Care   Discharge patient date: 06/18/2023      Allergies as of 06/18/2023   No Known Allergies      Medication List     TAKE these medications    amoxicillin -clavulanate 875-125 MG tablet Commonly known as: AUGMENTIN  Take 1 tablet by mouth 2 (two) times daily.   BC HEADACHE POWDER PO Take 1 packet by mouth every 8 (eight) hours as needed.   IRON PO Take 1 tablet by mouth daily.   ondansetron  4 MG tablet Commonly known as: Zofran  Take 1 tablet (4 mg total) by mouth daily as needed.   Prenatal 19 tablet Take 1 tablet by mouth daily.        Follow-up Information     [facility] Follow up in 2 week(s).   Contact information: [address] [place]  [address] [phone]                Signed: Onnie Bilis M.D. 06/18/2023, 10:01 AM

## 2023-06-18 NOTE — Plan of Care (Signed)
  Problem: Education: Goal: Knowledge of General Education information will improve Description: Including pain rating scale, medication(s)/side effects and non-pharmacologic comfort measures Outcome: Completed/Met   Problem: Health Behavior/Discharge Planning: Goal: Ability to manage health-related needs will improve Outcome: Completed/Met   Problem: Clinical Measurements: Goal: Ability to maintain clinical measurements within normal limits will improve Outcome: Completed/Met Goal: Will remain free from infection Outcome: Completed/Met Goal: Diagnostic test results will improve Outcome: Completed/Met Goal: Respiratory complications will improve Outcome: Completed/Met Goal: Cardiovascular complication will be avoided Outcome: Completed/Met   Problem: Activity: Goal: Risk for activity intolerance will decrease Outcome: Completed/Met   Problem: Nutrition: Goal: Adequate nutrition will be maintained Outcome: Completed/Met   Problem: Coping: Goal: Level of anxiety will decrease Outcome: Completed/Met   Problem: Elimination: Goal: Will not experience complications related to bowel motility Outcome: Completed/Met Goal: Will not experience complications related to urinary retention Outcome: Completed/Met   Problem: Pain Managment: Goal: General experience of comfort will improve and/or be controlled Outcome: Completed/Met   Problem: Safety: Goal: Ability to remain free from injury will improve Outcome: Completed/Met   Problem: Skin Integrity: Goal: Risk for impaired skin integrity will decrease Outcome: Completed/Met   Problem: Education: Goal: Knowledge of disease or condition will improve Outcome: Completed/Met Goal: Knowledge of the prescribed therapeutic regimen will improve Outcome: Completed/Met Goal: Individualized Educational Video(s) Outcome: Completed/Met   Problem: Clinical Measurements: Goal: Complications related to the disease process, condition or  treatment will be avoided or minimized Outcome: Completed/Met

## 2023-06-21 DIAGNOSIS — Z8759 Personal history of other complications of pregnancy, childbirth and the puerperium: Secondary | ICD-10-CM | POA: Insufficient documentation

## 2023-06-24 ENCOUNTER — Ambulatory Visit: Attending: Obstetrics and Gynecology | Admitting: Obstetrics

## 2023-06-24 ENCOUNTER — Other Ambulatory Visit: Payer: Self-pay | Admitting: *Deleted

## 2023-06-24 ENCOUNTER — Ambulatory Visit

## 2023-06-24 VITALS — BP 110/69 | HR 84

## 2023-06-24 DIAGNOSIS — Z363 Encounter for antenatal screening for malformations: Secondary | ICD-10-CM | POA: Diagnosis not present

## 2023-06-24 DIAGNOSIS — O365930 Maternal care for other known or suspected poor fetal growth, third trimester, not applicable or unspecified: Secondary | ICD-10-CM | POA: Insufficient documentation

## 2023-06-24 DIAGNOSIS — Z36 Encounter for antenatal screening for chromosomal anomalies: Secondary | ICD-10-CM | POA: Diagnosis not present

## 2023-06-24 DIAGNOSIS — Z8759 Personal history of other complications of pregnancy, childbirth and the puerperium: Secondary | ICD-10-CM | POA: Diagnosis not present

## 2023-06-24 DIAGNOSIS — Z3A28 28 weeks gestation of pregnancy: Secondary | ICD-10-CM | POA: Diagnosis not present

## 2023-06-24 NOTE — Progress Notes (Signed)
 MFM Consult Note  Jasmine Perkins is currently at 28 weeks and 4 days.  She was seen due to IUGR noted on prior ultrasound exams performed in your office.    The patient reports that her two other children were delivered at 36 to 37 weeks weighing about 5-1/2 pounds. The FOB reports that he was delivered at term weighing 4 pounds.    She denies any other problems in her current pregnancy and reports feeling vigorous fetal movements throughout the day.    On today's exam, the EFW of 2 pounds 2 ounces measures at the 2nd percentile for her gestational age indicating IUGR.    There was normal amniotic fluid noted with a maximal vertical pocket of 3.1 cm.  The views of the fetal anatomy were limited today due to her advanced gestational age.  What was visualized today appeared within normal limits.  Fetal movements were noted throughout today's ultrasound exam.  Doppler studies of the umbilical arteries showed a normal S/D ratio of 3.03 . There were no signs of absent or reversed end-diastolic flow.    The patient was reassured that IUGR is a common finding.  Based on her past pregnancy history and the birth weight of the father of the baby, her fetus is most likely constitutionally small.  The increased risk of adverse pregnancy outcomes such as an IUFD associated with IUGR was discussed.  She was advised to continue monitoring fetal movements daily.  Due to IUGR, we will continue to follow her with weekly fetal testing and umbilical artery Doppler studies.    We will reassess the fetal growth again in 3 weeks.    Should IUGR continue to be noted later in her pregnancy, delivery will be recommended at between 37 to 38 weeks.  She will return in 1 week for an NST and umbilical artery Doppler study.   The patient stated that all of her questions were answered today.  A total of 45 minutes was spent counseling and coordinating the care for this patient.  Greater than 50% of the time was spent  in direct face-to-face contact.

## 2023-06-29 ENCOUNTER — Ambulatory Visit

## 2023-07-01 ENCOUNTER — Ambulatory Visit

## 2023-07-01 ENCOUNTER — Other Ambulatory Visit

## 2023-07-04 LAB — OB RESULTS CONSOLE HIV ANTIBODY (ROUTINE TESTING): HIV: NONREACTIVE

## 2023-07-08 ENCOUNTER — Other Ambulatory Visit: Payer: Self-pay

## 2023-07-08 DIAGNOSIS — O365990 Maternal care for other known or suspected poor fetal growth, unspecified trimester, not applicable or unspecified: Secondary | ICD-10-CM

## 2023-07-21 DIAGNOSIS — R7303 Prediabetes: Secondary | ICD-10-CM | POA: Insufficient documentation

## 2023-07-22 ENCOUNTER — Other Ambulatory Visit: Payer: Self-pay | Admitting: Obstetrics

## 2023-07-22 ENCOUNTER — Ambulatory Visit

## 2023-07-22 ENCOUNTER — Ambulatory Visit: Attending: Maternal & Fetal Medicine

## 2023-07-22 ENCOUNTER — Other Ambulatory Visit: Payer: Self-pay | Admitting: *Deleted

## 2023-07-22 ENCOUNTER — Ambulatory Visit (HOSPITAL_BASED_OUTPATIENT_CLINIC_OR_DEPARTMENT_OTHER): Admitting: Obstetrics and Gynecology

## 2023-07-22 VITALS — BP 116/68 | HR 71

## 2023-07-22 DIAGNOSIS — O365930 Maternal care for other known or suspected poor fetal growth, third trimester, not applicable or unspecified: Secondary | ICD-10-CM

## 2023-07-22 DIAGNOSIS — Z3A32 32 weeks gestation of pregnancy: Secondary | ICD-10-CM

## 2023-07-22 DIAGNOSIS — O365990 Maternal care for other known or suspected poor fetal growth, unspecified trimester, not applicable or unspecified: Secondary | ICD-10-CM

## 2023-07-22 NOTE — Procedures (Signed)
 LENNAN MALONE [birth year] [redacted]w[redacted]d  Fetus A Non-Stress Test Interpretation for 07/22/23  Indication: IUGR  Fetal Heart Rate A Mode: External Baseline Rate (A): 135 bpm Variability: Moderate Accelerations: 10 x 10 Decelerations: None Multiple birth?: No  Uterine Activity Mode: Palpation, Toco Contraction Frequency (min): none noted Resting Tone Palpated: Relaxed  Interpretation (Fetal Testing) Nonstress Test Interpretation: Reactive Comments: Reviewed with Dr. Arnie Bibber

## 2023-07-22 NOTE — Progress Notes (Signed)
 Maternal-Fetal Medicine Consultation Name: [name] MRN: [id number]  G4 X5284 at 32w 4d gestation. Severe fetal growth restriction.  On ultrasound performed 4 weeks ago, the estimated fetal weight was at the 2nd percentile.  Patient does not have hypertension. Obstetric history is significant for 2 vaginal deliveries and her children weighed 5 pounds 13 ounces and 5 pounds 15 ounces at birth.  They are in good health. Patient does not have hypertension.  She does not have gestational diabetes.  Blood pressure today at our office is 116/68 mmHg.  Ultrasound On today's ultrasound, amniotic fluid is normal good fetal activity seen.  Cephalic presentation.  The estimated fetal weight is at the 2nd percentile and the abdominal circumference measurement at the 9th percentile.  Interval weight gain over 4 weeks is 572 g. Umbilical artery Doppler showed normal forward diastolic flow.  NST is reactive.  BPP 10/10.  I explained the finding of severe fetal growth restriction but reassured her of adequate interval growth.  I discussed the significance of umbilical artery Doppler study that is reassuring. We will discuss timing of delivery after next fetal growth assessment.  If severe fetal growth restriction persists, delivery should be considered at [redacted] weeks gestation.  Recommendations - Patient has an appointment for antenatal testing next week. - We recommend weekly antenatal testing till delivery and that may be performed at your office if you prefer. -Fetal growth assessment in 3 weeks.  Consultation including face-to-face (more than 50%) counseling 10 minutes.

## 2023-07-29 ENCOUNTER — Ambulatory Visit

## 2023-07-29 ENCOUNTER — Other Ambulatory Visit

## 2023-08-02 ENCOUNTER — Other Ambulatory Visit: Payer: Self-pay | Admitting: Obstetrics and Gynecology

## 2023-08-02 DIAGNOSIS — O365990 Maternal care for other known or suspected poor fetal growth, unspecified trimester, not applicable or unspecified: Secondary | ICD-10-CM

## 2023-08-09 LAB — OB RESULTS CONSOLE GC/CHLAMYDIA
Chlamydia: NEGATIVE
Neisseria Gonorrhea: NEGATIVE

## 2023-08-09 LAB — OB RESULTS CONSOLE GBS: GBS: NEGATIVE

## 2023-08-22 ENCOUNTER — Encounter
Admission: EM | Disposition: A | Discharge status: Home / Self Care | Payer: Self-pay | Source: Home / Self Care | Attending: Obstetrics and Gynecology

## 2023-08-22 ENCOUNTER — Inpatient Hospital Stay: Admitting: Anesthesiology

## 2023-08-22 ENCOUNTER — Inpatient Hospital Stay
Admission: EM | Admit: 2023-08-22 | Discharge: 2023-08-24 | DRG: 807 | Disposition: A | Discharge status: Home / Self Care | Attending: Obstetrics | Admitting: Obstetrics

## 2023-08-22 ENCOUNTER — Other Ambulatory Visit: Payer: Self-pay

## 2023-08-22 ENCOUNTER — Encounter: Payer: Self-pay | Admitting: Obstetrics and Gynecology

## 2023-08-22 DIAGNOSIS — O365990 Maternal care for other known or suspected poor fetal growth, unspecified trimester, not applicable or unspecified: Principal | ICD-10-CM | POA: Diagnosis present

## 2023-08-22 DIAGNOSIS — O365930 Maternal care for other known or suspected poor fetal growth, third trimester, not applicable or unspecified: Principal | ICD-10-CM | POA: Diagnosis present

## 2023-08-22 DIAGNOSIS — Z3A37 37 weeks gestation of pregnancy: Secondary | ICD-10-CM | POA: Diagnosis not present

## 2023-08-22 DIAGNOSIS — Z8759 Personal history of other complications of pregnancy, childbirth and the puerperium: Secondary | ICD-10-CM

## 2023-08-22 DIAGNOSIS — O3503X0 Maternal care for (suspected) central nervous system malformation or damage in fetus, choroid plexus cysts, not applicable or unspecified: Secondary | ICD-10-CM | POA: Diagnosis present

## 2023-08-22 DIAGNOSIS — K219 Gastro-esophageal reflux disease without esophagitis: Secondary | ICD-10-CM | POA: Diagnosis present

## 2023-08-22 DIAGNOSIS — Z8249 Family history of ischemic heart disease and other diseases of the circulatory system: Secondary | ICD-10-CM

## 2023-08-22 DIAGNOSIS — O9962 Diseases of the digestive system complicating childbirth: Secondary | ICD-10-CM | POA: Diagnosis present

## 2023-08-22 LAB — CBC
HCT: 36.1 % (ref 36.0–46.0)
Hemoglobin: 11.7 g/dL — ABNORMAL LOW (ref 12.0–15.0)
MCH: 27.4 pg (ref 26.0–34.0)
MCHC: 32.4 g/dL (ref 30.0–36.0)
MCV: 84.5 fL (ref 80.0–100.0)
Platelets: 223 10*3/uL (ref 150–400)
RBC: 4.27 MIL/uL (ref 3.87–5.11)
RDW: 13.4 % (ref 11.5–15.5)
WBC: 9.1 10*3/uL (ref 4.0–10.5)
nRBC: 0 % (ref 0.0–0.2)

## 2023-08-22 LAB — TYPE AND SCREEN
ABO/RH(D): A POS
Antibody Screen: NEGATIVE

## 2023-08-22 LAB — RPR: RPR Ser Ql: NONREACTIVE

## 2023-08-22 SURGERY — Surgical Case
Anesthesia: Epidural

## 2023-08-22 MED ORDER — METHYLERGONOVINE MALEATE 0.2 MG/ML IJ SOLN
INTRAMUSCULAR | Status: AC
  Filled 2023-08-22: qty 1

## 2023-08-22 MED ORDER — MISOPROSTOL 50MCG HALF TABLET
ORAL_TABLET | ORAL | Status: AC
  Filled 2023-08-22: qty 2

## 2023-08-22 MED ORDER — LIDOCAINE HCL (PF) 2 % IJ SOLN
INTRAMUSCULAR | Status: DC | PRN
  Administered 2023-08-22: 100 mg via EPIDURAL

## 2023-08-22 MED ORDER — SODIUM CHLORIDE 0.9 % IV SOLN
500.0000 mg | INTRAVENOUS | Status: DC
  Administered 2023-08-22: 500 mg via INTRAVENOUS

## 2023-08-22 MED ORDER — SOD CITRATE-CITRIC ACID 500-334 MG/5ML PO SOLN: 30.0000 mL | ORAL | Status: DC | PRN

## 2023-08-22 MED ORDER — ZOLPIDEM TARTRATE 5 MG PO TABS
5.0000 mg | ORAL_TABLET | Freq: Every evening | ORAL | Status: DC | PRN
Start: 2023-08-22 — End: 2023-08-22

## 2023-08-22 MED ORDER — OXYCODONE-ACETAMINOPHEN 5-325 MG PO TABS: 1.0000 | ORAL_TABLET | ORAL | Status: DC | PRN

## 2023-08-22 MED ORDER — KETOROLAC TROMETHAMINE 30 MG/ML IJ SOLN: 30.0000 mg | Freq: Four times a day (QID) | INTRAMUSCULAR | Status: DC

## 2023-08-22 MED ORDER — LIDOCAINE HCL (PF) 2 % IJ SOLN
INTRAMUSCULAR | Status: AC
  Filled 2023-08-22: qty 10

## 2023-08-22 MED ORDER — EPHEDRINE 5 MG/ML INJ: 10.0000 mg | INTRAVENOUS | Status: DC | PRN

## 2023-08-22 MED ORDER — DIBUCAINE (PERIANAL) 1 % EX OINT: 1.0000 | TOPICAL_OINTMENT | CUTANEOUS | Status: DC | PRN

## 2023-08-22 MED ORDER — SIMETHICONE 80 MG PO CHEW: 80.0000 mg | CHEWABLE_TABLET | ORAL | Status: DC | PRN

## 2023-08-22 MED ORDER — SENNOSIDES-DOCUSATE SODIUM 8.6-50 MG PO TABS
2.0000 | ORAL_TABLET | Freq: Every day | ORAL | Status: DC
  Administered 2023-08-23 – 2023-08-24 (×2): 2 via ORAL
  Filled 2023-08-22 (×2): qty 2

## 2023-08-22 MED ORDER — SODIUM CHLORIDE 0.9 % IV SOLN
INTRAVENOUS | Status: DC | PRN
  Administered 2023-08-22 (×2): 5 mL via EPIDURAL

## 2023-08-22 MED ORDER — OXYTOCIN-SODIUM CHLORIDE 30-0.9 UT/500ML-% IV SOLN: 2.5000 [IU]/h | INTRAVENOUS | Status: DC

## 2023-08-22 MED ORDER — SENNOSIDES-DOCUSATE SODIUM 8.6-50 MG PO TABS: 2.0000 | ORAL_TABLET | Freq: Every day | ORAL | Status: DC

## 2023-08-22 MED ORDER — BUPIVACAINE HCL (PF) 0.5 % IJ SOLN
INTRAMUSCULAR | Status: AC
  Filled 2023-08-22: qty 30

## 2023-08-22 MED ORDER — LIDOCAINE-EPINEPHRINE (PF) 1.5 %-1:200000 IJ SOLN
INTRAMUSCULAR | Status: DC | PRN
  Administered 2023-08-22: 3 mL via PERINEURAL

## 2023-08-22 MED ORDER — LACTATED RINGERS IV SOLN
500.0000 mL | INTRAVENOUS | Status: DC | PRN
  Administered 2023-08-22: 500 mL via INTRAVENOUS

## 2023-08-22 MED ORDER — FENTANYL-BUPIVACAINE-NACL 0.5-0.125-0.9 MG/250ML-% EP SOLN
12.0000 mL/h | EPIDURAL | Status: DC | PRN
  Administered 2023-08-22: 12 mL/h via EPIDURAL

## 2023-08-22 MED ORDER — GABAPENTIN 300 MG PO CAPS
ORAL_CAPSULE | ORAL | Status: AC
  Administered 2023-08-22: 300 mg
  Filled 2023-08-22: qty 1

## 2023-08-22 MED ORDER — FERROUS SULFATE 325 (65 FE) MG PO TABS
325.0000 mg | ORAL_TABLET | Freq: Two times a day (BID) | ORAL | Status: DC
  Administered 2023-08-23 – 2023-08-24 (×4): 325 mg via ORAL
  Filled 2023-08-22 (×4): qty 1

## 2023-08-22 MED ORDER — PHENYLEPHRINE 80 MCG/ML (10ML) SYRINGE FOR IV PUSH (FOR BLOOD PRESSURE SUPPORT): 80.0000 ug | PREFILLED_SYRINGE | INTRAVENOUS | Status: DC | PRN

## 2023-08-22 MED ORDER — LIDOCAINE HCL (PF) 1 % IJ SOLN: 30.0000 mL | INTRAMUSCULAR | Status: DC | PRN

## 2023-08-22 MED ORDER — SENNOSIDES-DOCUSATE SODIUM 8.6-50 MG PO TABS: 2.0000 | ORAL_TABLET | ORAL | Status: DC

## 2023-08-22 MED ORDER — MISOPROSTOL 50MCG HALF TABLET: 50.0000 ug | ORAL_TABLET | Freq: Once | ORAL | Status: DC

## 2023-08-22 MED ORDER — OXYTOCIN-SODIUM CHLORIDE 30-0.9 UT/500ML-% IV SOLN
1.0000 m[IU]/min | INTRAVENOUS | Status: DC
  Administered 2023-08-22: 2 m[IU]/min via INTRAVENOUS

## 2023-08-22 MED ORDER — ONDANSETRON HCL 4 MG/2ML IJ SOLN
4.0000 mg | Freq: Four times a day (QID) | INTRAMUSCULAR | Status: DC | PRN
  Administered 2023-08-22: 4 mg via INTRAVENOUS
  Filled 2023-08-22: qty 2

## 2023-08-22 MED ORDER — AMMONIA AROMATIC IN INHA
RESPIRATORY_TRACT | Status: AC
  Filled 2023-08-22: qty 10

## 2023-08-22 MED ORDER — LACTATED RINGERS IV SOLN
500.0000 mL | Freq: Once | INTRAVENOUS | Status: AC
  Administered 2023-08-22: 500 mL via INTRAVENOUS

## 2023-08-22 MED ORDER — MISOPROSTOL 200 MCG PO TABS
ORAL_TABLET | ORAL | Status: AC
  Filled 2023-08-22: qty 4

## 2023-08-22 MED ORDER — ACETAMINOPHEN 500 MG PO TABS: 1000.0000 mg | ORAL_TABLET | Freq: Four times a day (QID) | ORAL | Status: DC

## 2023-08-22 MED ORDER — ACETAMINOPHEN 500 MG PO TABS
1000.0000 mg | ORAL_TABLET | Freq: Once | ORAL | Status: AC
  Administered 2023-08-22: 1000 mg via ORAL

## 2023-08-22 MED ORDER — BUPIVACAINE HCL (PF) 0.25 % IJ SOLN
INTRAMUSCULAR | Status: AC
  Filled 2023-08-22: qty 60

## 2023-08-22 MED ORDER — OXYCODONE-ACETAMINOPHEN 5-325 MG PO TABS: 2.0000 | ORAL_TABLET | ORAL | Status: DC | PRN

## 2023-08-22 MED ORDER — OXYCODONE HCL 5 MG PO TABS: 5.0000 mg | ORAL_TABLET | ORAL | Status: DC | PRN

## 2023-08-22 MED ORDER — COCONUT OIL OIL: 1.0000 | TOPICAL_OIL | Status: DC | PRN

## 2023-08-22 MED ORDER — ACETAMINOPHEN 325 MG PO TABS: 650.0000 mg | ORAL_TABLET | ORAL | Status: DC | PRN

## 2023-08-22 MED ORDER — LACTATED RINGERS IV SOLN: INTRAVENOUS | Status: DC

## 2023-08-22 MED ORDER — ONDANSETRON HCL 4 MG PO TABS
4.0000 mg | ORAL_TABLET | ORAL | Status: DC | PRN
Start: 2023-08-22 — End: 2023-08-24

## 2023-08-22 MED ORDER — OXYTOCIN BOLUS FROM INFUSION: 333.0000 mL | Freq: Once | INTRAVENOUS | Status: DC

## 2023-08-22 MED ORDER — DIPHENHYDRAMINE HCL 25 MG PO CAPS: 25.0000 mg | ORAL_CAPSULE | Freq: Four times a day (QID) | ORAL | Status: DC | PRN

## 2023-08-22 MED ORDER — TERBUTALINE SULFATE 1 MG/ML IJ SOLN: 0.2500 mg | Freq: Once | INTRAMUSCULAR | Status: DC | PRN

## 2023-08-22 MED ORDER — MEASLES, MUMPS & RUBELLA VAC IJ SOLR
0.5000 mL | Freq: Once | INTRAMUSCULAR | Status: DC
  Filled 2023-08-22: qty 0.5

## 2023-08-22 MED ORDER — GABAPENTIN 300 MG PO CAPS: 300.0000 mg | ORAL_CAPSULE | Freq: Every day | ORAL | Status: DC

## 2023-08-22 MED ORDER — TERBUTALINE SULFATE 1 MG/ML IJ SOLN
0.2500 mg | Freq: Once | INTRAMUSCULAR | Status: AC | PRN
  Administered 2023-08-22: 0.25 mg via SUBCUTANEOUS

## 2023-08-22 MED ORDER — COCONUT OIL OIL
1.0000 | TOPICAL_OIL | Status: DC | PRN
  Filled 2023-08-22: qty 7.5

## 2023-08-22 MED ORDER — WITCH HAZEL-GLYCERIN EX PADS: 1.0000 | MEDICATED_PAD | CUTANEOUS | Status: DC | PRN

## 2023-08-22 MED ORDER — PRENATAL MULTIVITAMIN CH
1.0000 | ORAL_TABLET | Freq: Every day | ORAL | Status: DC
  Administered 2023-08-23 – 2023-08-24 (×2): 1 via ORAL
  Filled 2023-08-22 (×3): qty 1

## 2023-08-22 MED ORDER — MAGNESIUM HYDROXIDE 400 MG/5ML PO SUSP: 30.0000 mL | ORAL | Status: DC | PRN

## 2023-08-22 MED ORDER — MORPHINE SULFATE (PF) 2 MG/ML IV SOLN: 1.0000 mg | INTRAVENOUS | Status: DC | PRN

## 2023-08-22 MED ORDER — MENTHOL 3 MG MT LOZG: 1.0000 | LOZENGE | OROMUCOSAL | Status: DC | PRN

## 2023-08-22 MED ORDER — ACETAMINOPHEN 325 MG PO TABS
650.0000 mg | ORAL_TABLET | ORAL | Status: DC | PRN
  Administered 2023-08-23 (×2): 650 mg via ORAL
  Filled 2023-08-22 (×2): qty 2

## 2023-08-22 MED ORDER — IBUPROFEN 600 MG PO TABS
600.0000 mg | ORAL_TABLET | Freq: Four times a day (QID) | ORAL | Status: DC
  Administered 2023-08-23: 600 mg via ORAL
  Filled 2023-08-22: qty 1

## 2023-08-22 MED ORDER — OXYTOCIN-SODIUM CHLORIDE 30-0.9 UT/500ML-% IV SOLN
INTRAVENOUS | Status: DC | PRN
  Administered 2023-08-22: 300 mL via INTRAVENOUS

## 2023-08-22 MED ORDER — EPHEDRINE 5 MG/ML INJ
10.0000 mg | INTRAVENOUS | Status: DC | PRN
Start: 2023-08-22 — End: 2023-08-22

## 2023-08-22 MED ORDER — DIPHENHYDRAMINE HCL 50 MG/ML IJ SOLN: 12.5000 mg | INTRAMUSCULAR | Status: DC | PRN

## 2023-08-22 MED ORDER — FENTANYL CITRATE (PF) 100 MCG/2ML IJ SOLN
INTRAMUSCULAR | Status: AC
  Filled 2023-08-22: qty 2

## 2023-08-22 MED ORDER — TRANEXAMIC ACID-NACL 1000-0.7 MG/100ML-% IV SOLN
INTRAVENOUS | Status: AC
  Filled 2023-08-22: qty 100

## 2023-08-22 MED ORDER — ENOXAPARIN SODIUM 40 MG/0.4ML IJ SOSY
40.0000 mg | PREFILLED_SYRINGE | INTRAMUSCULAR | Status: DC
  Filled 2023-08-22: qty 0.4

## 2023-08-22 MED ORDER — BENZOCAINE-MENTHOL 20-0.5 % EX AERO: 1.0000 | INHALATION_SPRAY | CUTANEOUS | Status: DC | PRN

## 2023-08-22 MED ORDER — ONDANSETRON HCL 4 MG/2ML IJ SOLN: 4.0000 mg | INTRAMUSCULAR | Status: DC | PRN

## 2023-08-22 MED ORDER — OXYTOCIN-SODIUM CHLORIDE 30-0.9 UT/500ML-% IV SOLN
2.5000 [IU]/h | INTRAVENOUS | Status: DC
  Administered 2023-08-22: 2.5 [IU]/h via INTRAVENOUS
  Filled 2023-08-22 (×2): qty 500

## 2023-08-22 MED ORDER — SOD CITRATE-CITRIC ACID 500-334 MG/5ML PO SOLN
ORAL | Status: AC
  Administered 2023-08-22: 30 mL via ORAL
  Filled 2023-08-22: qty 15

## 2023-08-22 MED ORDER — SIMETHICONE 80 MG PO CHEW: 80.0000 mg | CHEWABLE_TABLET | Freq: Three times a day (TID) | ORAL | Status: DC

## 2023-08-22 MED ORDER — TERBUTALINE SULFATE 1 MG/ML IJ SOLN
INTRAMUSCULAR | Status: AC
  Filled 2023-08-22: qty 1

## 2023-08-22 MED ORDER — OXYTOCIN 10 UNIT/ML IJ SOLN
INTRAMUSCULAR | Status: AC
  Filled 2023-08-22: qty 2

## 2023-08-22 MED ORDER — GABAPENTIN 300 MG PO CAPS: 300.0000 mg | ORAL_CAPSULE | Freq: Once | ORAL | Status: DC

## 2023-08-22 MED ORDER — MISOPROSTOL 25 MCG QUARTER TABLET: 25.0000 ug | ORAL_TABLET | ORAL | Status: DC | PRN

## 2023-08-22 MED ORDER — IBUPROFEN 600 MG PO TABS: 600.0000 mg | ORAL_TABLET | Freq: Four times a day (QID) | ORAL | Status: DC

## 2023-08-22 MED ORDER — FENTANYL CITRATE (PF) 100 MCG/2ML IJ SOLN
INTRAMUSCULAR | Status: DC | PRN
  Administered 2023-08-22: 100 ug via EPIDURAL

## 2023-08-22 MED ORDER — ZOLPIDEM TARTRATE 5 MG PO TABS: 5.0000 mg | ORAL_TABLET | Freq: Every evening | ORAL | Status: DC | PRN

## 2023-08-22 MED ORDER — PRENATAL MULTIVITAMIN CH: 1.0000 | ORAL_TABLET | Freq: Every day | ORAL | Status: DC

## 2023-08-22 MED ORDER — ACETAMINOPHEN 500 MG PO TABS
ORAL_TABLET | ORAL | Status: AC
  Filled 2023-08-22: qty 2

## 2023-08-22 MED ORDER — FENTANYL-BUPIVACAINE-NACL 0.5-0.125-0.9 MG/250ML-% EP SOLN
EPIDURAL | Status: AC
  Filled 2023-08-22: qty 250

## 2023-08-22 MED ORDER — CEFAZOLIN SODIUM-DEXTROSE 2-4 GM/100ML-% IV SOLN
2.0000 g | Freq: Once | INTRAVENOUS | Status: AC
  Administered 2023-08-22: 2 g via INTRAVENOUS

## 2023-08-22 MED ORDER — PHENYLEPHRINE HCL-NACL 20-0.9 MG/250ML-% IV SOLN
INTRAVENOUS | Status: AC
  Filled 2023-08-22: qty 250

## 2023-08-22 MED ORDER — LIDOCAINE HCL (PF) 1 % IJ SOLN
INTRAMUSCULAR | Status: AC
  Filled 2023-08-22: qty 30

## 2023-08-22 MED ORDER — CEFAZOLIN SODIUM-DEXTROSE 2-4 GM/100ML-% IV SOLN
INTRAVENOUS | Status: AC
  Filled 2023-08-22: qty 100

## 2023-08-22 MED ORDER — LACTATED RINGERS AMNIOINFUSION
INTRAVENOUS | Status: DC
  Administered 2023-08-22: 500 mL via INTRAUTERINE
  Filled 2023-08-22: qty 1000

## 2023-08-22 MED ORDER — LIDOCAINE HCL (PF) 1 % IJ SOLN
INTRAMUSCULAR | Status: DC | PRN
  Administered 2023-08-22: 2 mL via SUBCUTANEOUS

## 2023-08-22 MED ORDER — SODIUM CHLORIDE 0.9 % IV SOLN
INTRAVENOUS | Status: AC
  Filled 2023-08-22: qty 5

## 2023-08-22 SURGICAL SUPPLY — 25 items
CHLORAPREP W/TINT 26 (MISCELLANEOUS) ×1 IMPLANT
DRSG TELFA 3X8 NADH STRL (GAUZE/BANDAGES/DRESSINGS) ×1 IMPLANT
ELECT CAUTERY BLADE 6.4 (BLADE) ×1 IMPLANT
ELECTRODE REM PT RTRN 9FT ADLT (ELECTROSURGICAL) ×1 IMPLANT
GAUZE SPONGE 4X4 12PLY STRL (GAUZE/BANDAGES/DRESSINGS) ×1 IMPLANT
GLOVE SURG SYN 8.0 (GLOVE) ×1 IMPLANT
GLOVE SURG SYN 8.0 PF PI (GLOVE) ×1 IMPLANT
GOWN STRL REUS W/ TWL LRG LVL3 (GOWN DISPOSABLE) ×2 IMPLANT
GOWN STRL REUS W/ TWL XL LVL3 (GOWN DISPOSABLE) ×1 IMPLANT
MANIFOLD NEPTUNE II (INSTRUMENTS) ×1 IMPLANT
MAT PREVALON FULL STRYKER (MISCELLANEOUS) ×1 IMPLANT
NDL HYPO 22X1.5 SAFETY MO (MISCELLANEOUS) ×1 IMPLANT
NEEDLE HYPO 22X1.5 SAFETY MO (MISCELLANEOUS) ×1 IMPLANT
NS IRRIG 1000ML POUR BTL (IV SOLUTION) ×1 IMPLANT
PACK C SECTION AR (MISCELLANEOUS) ×1 IMPLANT
PAD OB MATERNITY 11 LF (PERSONAL CARE ITEMS) ×1 IMPLANT
PAD PREP OB/GYN DISP 24X41 (PERSONAL CARE ITEMS) ×1 IMPLANT
SCRUB CHG 4% DYNA-HEX 4OZ (MISCELLANEOUS) ×1 IMPLANT
STRAP SAFETY 5IN WIDE (MISCELLANEOUS) ×1 IMPLANT
SUT CHROMIC 1 CTX 36 (SUTURE) ×3 IMPLANT
SUT PLAIN GUT 0 (SUTURE) ×2 IMPLANT
SUT VIC AB 0 CT1 36 (SUTURE) ×2 IMPLANT
SYR 30ML LL (SYRINGE) ×2 IMPLANT
TRAP FLUID SMOKE EVACUATOR (MISCELLANEOUS) ×1 IMPLANT
WATER STERILE IRR 500ML POUR (IV SOLUTION) ×1 IMPLANT

## 2023-08-22 NOTE — Discharge Summary (Signed)
 Postpartum Discharge Summary  Patient Name: Jasmine Perkins DOB: 1995-11-19 MRN: [id number]  Date of admission: 08/22/2023 Delivery date:This patient has no babies on file. Delivering provider: This patient has no babies on file. Date of discharge: 08/22/2023  Primary OB: [facility] OFE:Ejupzwu'd last menstrual period was 12/06/2022. EDC Estimated Date of Delivery: 09/12/23 Gestational Age at Delivery: [redacted]w[redacted]d   Admitting diagnosis: IUGR, antenatal [O36.5990] Intrauterine pregnancy: [redacted]w[redacted]d     Secondary diagnosis:   Principal Problem:   IUGR, antenatal   Discharge Diagnosis: Term Pregnancy Delivered and fetal growth restriction      Hospital course: Induction of Labor With Vaginal Delivery   28 y.o. yo [id number] at [redacted]w[redacted]d was admitted to the hospital 08/22/2023 for induction of labor.  Indication for induction: intrauterine growth restriction.  Patient had an labor course complicated by [facility]. Recurrent deep variable decelerations continued despite intrauterine resuscitation efforts. Patient was remote from delivery. Urgent c/section called for NRFHR. Cervix was found to be C/C/+3 when removing FSE. C/section held and patient was urgently prepared for vaginal delivery in the OR. Outlet vacuum applied to expedite delivery d/t continued variable decelerations. Vacuum assisted vaginal delivery of viable baby girl. Please see delivery not for further details. Membrane Rupture Time/Date: This patient has no babies on file.,This patient has no babies on file.  Delivery Method:This patient has no babies on file. Operative Delivery:Device used:Kiwi vacuum Indication: Fetal indications Episiotomy: This patient has no babies on file. Lacerations:  This patient has no babies on file. Details of delivery can be found in separate delivery note.  Patient had a postpartum course complicated by***. Patient is discharged home 08/22/23.  Newborn Data: Birth date:This patient has no babies on  file. Birth time:This patient has no babies on file. Gender:This patient has no babies on file. Living status:This patient has no babies on file. Apgars:This patient has no babies on file.,This patient has no babies on file. Weight:This patient has no babies on file.                                            Post partum procedures:{Postpartum procedures:23558} Induction:: AROM and Pitocin Complications: None Delivery Type: vacuum, outlet Anesthesia: epidural anesthesia Placenta: spontaneous To Pathology: Yes   Prenatal Labs:  Blood type/Rh A POS   Antibody screen neg  Rubella Immune (02/12 0000)   Varicella Immune  RPR NR  HBsAg Neg  Hep C NR  HIV NR  GC neg  Chlamydia neg  Genetic screening Patient declined   1 hour GTT 139  3 hour GTT N/A  GBS Negative/-- (06/10 0000)    Magnesium  Sulfate received: {Mag received:30440022} BMZ received: No Rhophylac:was not indicated MMR: was not indicated Varivax vaccine given: was not indicated - Tdap vaccine: Given prenatally - Flu vaccine: declined  -RSV vaccine: not in season   Transfusion:{Transfusion received:30440034}  Physical exam  Vitals:   08/22/23 1845 08/22/23 1850 08/22/23 1855 08/22/23 2009  BP:   104/69 129/67  Pulse:   (!) 55   Resp:      Temp:    (!) 97.5 F (36.4 C)  TempSrc:    Oral  SpO2: 100% 99% 100%   Weight:      Height:       General: {Exam; general:21111117} Lochia: {Desc; appropriate/inappropriate:30686::appropriate} Uterine Fundus: {Desc; firm/soft:30687} Perineum:***minimal edema/intact DVT Evaluation: {Exam; icu:7888877}  Labs: Lab Results  Component  Value Date   WBC 9.1 08/22/2023   HGB 11.7 (L) 08/22/2023   HCT 36.1 08/22/2023   MCV 84.5 08/22/2023   PLT 223 08/22/2023      Latest Ref Rng & Units 06/14/2023    7:38 PM  CMP  Glucose 70 - 99 mg/dL 97   BUN 6 - 20 mg/dL 6   Creatinine 9.55 - 8.99 mg/dL 9.28   Sodium 864 - 854 mmol/L 134   Potassium 3.5 - 5.1 mmol/L 3.8    Chloride 98 - 111 mmol/L 102   CO2 22 - 32 mmol/L 22   Calcium  8.9 - 10.3 mg/dL 8.8    Edinburgh Score:     No data to display          Risk assessment for postpartum VTE and prophylactic treatment: Very high risk factors: None High risk factors: None Moderate risk factors: None  Postpartum VTE prophylaxis with LMWH not indicated  After visit meds:  Allergies as of 08/22/2023   No Known Allergies   Med Rec must be completed prior to using this [facility]***      Discharge home in stable condition Infant Feeding: Bottle Infant Disposition:{CHL IP OB HOME WITH FNUYZM:76418} Discharge instruction: per After Visit Summary and Postpartum booklet. Activity: Advance as tolerated. Pelvic rest for 6 weeks.  Diet: routine diet Anticipated Birth Control:  Contraceptives: Nexplanon versus BTL *** Postpartum Appointment:6 weeks Additional Postpartum F/U: {PP Procedure:23957} Future Appointments:No future appointments. Follow up Visit:  Plan:  Ileana DELENA Ada was discharged to home in good condition. Follow-up appointment as directed.    Signed: *** Hit refresh and delete this line

## 2023-08-22 NOTE — Anesthesia Preprocedure Evaluation (Signed)
 Anesthesia Evaluation  Patient identified by MRN, date of birth, ID band Patient awake    Reviewed: Allergy & Precautions, H&P , NPO status , Patient's Chart, lab work & pertinent test results, reviewed documented beta blocker date and time   History of Anesthesia Complications Negative for: history of anesthetic complications  Airway Mallampati: I  TM Distance: >3 FB Neck ROM: full    Dental no notable dental hx. (+) Missing, Dental Advidsory Given   Pulmonary neg pulmonary ROS   Pulmonary exam normal breath sounds clear to auscultation       Cardiovascular Exercise Tolerance: Good negative cardio ROS Normal cardiovascular exam Rhythm:regular Rate:Normal     Neuro/Psych negative neurological ROS  negative psych ROS   GI/Hepatic Neg liver ROS,GERD  ,,  Endo/Other  negative endocrine ROS    Renal/GU negative Renal ROS  negative genitourinary   Musculoskeletal   Abdominal   Peds  Hematology negative hematology ROS (+)   Anesthesia Other Findings Past Medical History: No date: Medical history non-contributory   Reproductive/Obstetrics (+) Pregnancy                             Anesthesia Physical Anesthesia Plan  ASA: 2  Anesthesia Plan: Epidural   Post-op Pain Management:    Induction:   PONV Risk Score and Plan:   Airway Management Planned:   Additional Equipment:   Intra-op Plan:   Post-operative Plan:   Informed Consent: I have reviewed the patients History and Physical, chart, labs and discussed the procedure including the risks, benefits and alternatives for the proposed anesthesia with the patient or authorized representative who has indicated his/her understanding and acceptance.     Dental Advisory Given  Plan Discussed with: Anesthesiologist, CRNA and Surgeon  Anesthesia Plan Comments:         Anesthesia Quick Evaluation

## 2023-08-22 NOTE — Transfer of Care (Signed)
 Immediate Anesthesia Transfer of Care Note  Patient: Jasmine Perkins Ada  Procedure(s) Performed: CESAREAN DELIVERY  Patient Location: LDR 1  Anesthesia Type:Epidural  Level of Consciousness: awake, alert , and oriented  Airway & Oxygen Therapy: Patient Spontanous Breathing  Post-op Assessment: Report given to RN and Post -op Vital signs reviewed and stable  Post vital signs: Reviewed and stable  Last Vitals:  Vitals Value Taken Time  BP    Temp    Pulse    Resp    SpO2      Last Pain:  Vitals:   08/22/23 1805  TempSrc: Oral  PainSc:          Complications: No notable events documented.

## 2023-08-22 NOTE — Progress Notes (Signed)
 Patient ID: Jasmine Perkins, female   DOB: [birth year], 28 y.o.   MRN: [id number] Recurrent deep variable decels after pitocin off and amnioinfusion started  I recommend proceeding to a Primary cesarean section . Pt declines sterilization  The risks of cesarean section discussed with the patient included but were not limited to: bleeding which may require transfusion or reoperation; infection which may require antibiotics; injury to bowel, bladder, ureters or other surrounding organs; injury to the fetus; need for additional procedures including hysterectomy in the event of a life-threatening hemorrhage; placental abnormalities wth subsequent pregnancies, incisional problems, thromboembolic phenomenon and other postoperative/anesthesia complications. The patient concurred with the proposed plan, giving informed written consent for the procedure.    Anesthesia and OR aware. Preoperative prophylactic antibiotics and SCDs ordered on call to the OR.  To OR when ready.

## 2023-08-22 NOTE — Discharge Instructions (Signed)

## 2023-08-22 NOTE — Progress Notes (Signed)
 Jasmine Perkins is a 28 y.o. [phone] at [redacted]w[redacted]d by  admitted for   Subjective:  Comfotable  Pitocin at 14mu/min  Objective: BP 96/63   Pulse 62   Temp 98.3 F (36.8 C) (Oral)   Resp 18   Ht 5' 7 (1.702 m)   Wt 85 kg   LMP 12/06/2022   SpO2 99%   BMI 29.35 kg/m  No intake/output data recorded. Total I/O In: 200 [P.O.:200] Out: 800 [Urine:800]  FHT:  FHR: 130 bpm, variability: minimal ,  accelerations:  Present,  decelerations:  Absent, intermittent variable decels  UC:   regular, every 2-3 minutes SVE:   Dilation: 4 Effacement (%): 90 Station: 0 Exam by:: Jasmine Nyjah Denio, MD  Labs: Lab Results  Component Value Date   WBC 9.1 08/22/2023   HGB 11.7 (L) 08/22/2023   HCT 36.1 08/22/2023   MCV 84.5 08/22/2023   PLT 223 08/22/2023    Assessment / Plan: Entering active phase . Slow progression despite adequate CTX pattern Continue Pitocin at given level      Jasmine JINNY Dinsmore, MD 08/22/2023, 5:52 PM

## 2023-08-22 NOTE — Anesthesia Procedure Notes (Signed)
 Epidural Patient location during procedure: OB Start time: 08/22/2023 6:57 AM End time: 08/22/2023 7:00 AM  Staffing Anesthesiologist: Dario Barter, MD Performed: anesthesiologist   Preanesthetic Checklist Completed: patient identified, IV checked, site marked, risks and benefits discussed, surgical consent, monitors and equipment checked, pre-op evaluation and timeout performed  Epidural Patient position: sitting Prep: ChloraPrep Patient monitoring: heart rate, continuous pulse ox and blood pressure Approach: midline Location: L3-L4 Injection technique: LOR saline  Needle:  Needle type: Tuohy  Needle gauge: 17 G Needle length: 9 cm Needle insertion depth: 5 cm Catheter type: closed end flexible Catheter size: 19 Gauge Catheter at skin depth: 10 cm Test dose: negative and 1.5% lidocaine with Epi 1:200 K  Assessment Sensory level: T10 Events: blood not aspirated, no cerebrospinal fluid, injection not painful, no injection resistance, no paresthesia and negative IV test  Additional Notes 1st attempt Pt. Evaluated and documentation done after procedure finished. Patient identified. Risks/Benefits/Options discussed with patient including but not limited to bleeding, infection, nerve damage, paralysis, failed block, incomplete pain control, headache, blood pressure changes, nausea, vomiting, reactions to medication both or allergic, itching and postpartum back pain. Confirmed with bedside nurse the patient's most recent platelet count. Confirmed with patient that they are not currently taking any anticoagulation, have any bleeding history or any family history of bleeding disorders. Patient expressed understanding and wished to proceed. All questions were answered. Sterile technique was used throughout the entire procedure. Please see nursing notes for vital signs. Test dose was given through epidural catheter and negative prior to continuing to dose epidural or start infusion.  Warning signs of high block given to the patient including shortness of breath, tingling/numbness in hands, complete motor block, or any concerning symptoms with instructions to call for help. Patient was given instructions on fall risk and not to get out of bed. All questions and concerns addressed with instructions to call with any issues or inadequate analgesia.    Patient tolerated the insertion well without immediate complications.Reason for block:procedure for pain

## 2023-08-22 NOTE — Progress Notes (Signed)
 Jasmine Perkins is a 28 y.o. [phone] at [redacted]w[redacted]d by admitted for   Subjective:  Comfortable  Pitocin at 18 mu/ min  Objective: BP 96/63   Pulse 62   Temp 97.9 F (36.6 C) (Oral)   Resp 18   LMP 12/06/2022   SpO2 99%  No intake/output data recorded. Total I/O In: 200 [P.O.:200] Out: -   FHT:  FHR: 130 bpm, variability: minimal ,  accelerations:  Present,  decelerations:  Absent except variables UC:   regular, every 3 minutes SVE:   Cx 3 cm / 80 % / -1  Fse placed  Labs: Lab Results  Component Value Date   WBC 9.1 08/22/2023   HGB 11.7 (L) 08/22/2023   HCT 36.1 08/22/2023   MCV 84.5 08/22/2023   PLT 223 08/22/2023    Assessment / Plan: Spontaneous labor, progressing normally Cat 1 fetal pattern  Continue Pitocin .   Debby JINNY Dinsmore, MD 08/22/2023, 12:32 PM

## 2023-08-22 NOTE — H&P (Signed)
 Jasmine Perkins is a 28 y.o. female presenting for IOL for severe IUGR .  EEGA 37+0  EDC 09/12/23. OB History     Gravida  4   Para  2   Term  1   Preterm  1   AB  1   Living  2      SAB  1   IAB      Ectopic      Multiple      Live Births  2          Past Medical History:  Diagnosis Date   Medical history non-contributory    Past Surgical History:  Procedure Laterality Date   submental lymph node biopsy N/A    Family History: family history includes Breast cancer in her maternal grandmother; Cancer in her maternal grandfather and maternal grandmother; Healthy in her brother, mother, sister, and sister; Heart attack in her father; Hypertension in her father. Social History:  reports that she has never smoked. She has never used smokeless tobacco. She reports that she does not drink alcohol and does not use drugs.     Maternal Diabetes: No Genetic Screening: Declined Maternal Ultrasounds/Referrals: IUGR efw 5/23/251526 gm ( 2%)  Fetal Ultrasounds or other Referrals:  None Maternal Substance Abuse:  No Significant Maternal Medications:  None Significant Maternal Lab Results:  Group B Strep negative Number of Prenatal Visits:greater than 3 verified prenatal visits Maternal Vaccinations:TDap Other Comments:  None  Review of Systems History Dilation: 3.5 Effacement (%): 60 Station: -2 Exam by:: Katie A RN Blood pressure 111/65, pulse 74, temperature 98.2 F (36.8 C), temperature source Oral, resp. rate 18, last menstrual period 12/06/2022, SpO2 100%. Exam By TJS cx AROM  clear  2 cm / 70 /-2 VTX  IUPC placed  Physical Exam  Lungs CTA CV RRR  Abd : gravid  EFM  Cat 1 ctx q 2-3 Pitocin at 10 mu/ min  Prenatal labs: ABO, Rh: --/--/A POS (06/23 0510) Antibody: NEG (06/23 0510) Rubella: Immune (02/12 0000) RPR:   NR HBsAg: Negative (02/12 0000)  HIV: Non-reactive (05/05 0000)  GBS: Negative/-- (06/10 0000)   Assessment/Plan: Severe IUGR  IOL  AROM  and IUPC placement . Continue Pitocin  CLE in place    Debby JINNY Dinsmore 08/22/2023, 8:33 AM

## 2023-08-23 ENCOUNTER — Encounter: Payer: Self-pay | Admitting: Obstetrics and Gynecology

## 2023-08-23 LAB — CBC
HCT: 33.1 % — ABNORMAL LOW (ref 36.0–46.0)
Hemoglobin: 11 g/dL — ABNORMAL LOW (ref 12.0–15.0)
MCH: 28.4 pg (ref 26.0–34.0)
MCHC: 33.2 g/dL (ref 30.0–36.0)
MCV: 85.3 fL (ref 80.0–100.0)
Platelets: 211 10*3/uL (ref 150–400)
RBC: 3.88 MIL/uL (ref 3.87–5.11)
RDW: 13.3 % (ref 11.5–15.5)
WBC: 13.1 10*3/uL — ABNORMAL HIGH (ref 4.0–10.5)
nRBC: 0 % (ref 0.0–0.2)

## 2023-08-23 MED ORDER — IBUPROFEN 600 MG PO TABS
600.0000 mg | ORAL_TABLET | Freq: Four times a day (QID) | ORAL | Status: DC
  Administered 2023-08-23 – 2023-08-24 (×6): 600 mg via ORAL
  Filled 2023-08-23 (×6): qty 1

## 2023-08-23 NOTE — Lactation Note (Signed)
 This note was copied from a baby's chart. Lactation Consultation Note  Patient Name: Girl Shiah Berhow Unijb'd Date: 08/23/2023 Age:26 hours Reason for consult: Initial assessment;1st time breastfeeding;Early term 37-38.6wks;Infant < 5lbs;Breastfeeding assistance;RN request   Maternal Data Has patient been taught Hand Expression?: Yes Does the patient have breastfeeding experience prior to this delivery?: No  Initial assessment w/ a 13hr old baby girl and a P3 patient. Infant born at [redacted]w[redacted]d, and weighs 4lbs 8.3oz.  Blood sugar for this visit was 48.  This was a vacuum assisted vaginal delivery.  Patient stated this is her first time trying to breastfeed. She did not breastfeed her other child.  Mom stated she had a negative experience w/ a RN in her other deliveries and gave up.   Patient has a Mom Cozy pump at home.   Feeding Mother's Current Feeding Choice: Breast Milk and Donor Milk Nipple Type: Slow - flow  LC attempted to assist patient w/ a feeding at the breast.  Infant was placed at the breast but showed no interest in eating.  Mom hand expressed milk that was finger fed to infant but this did not arouse her.  15ml of donor breast milk was warmed and patient fed to infant.  Infant took in 12ml total.  During this time [facility] taught mom how to pace bottle feed in side lying position.  Interventions Interventions: Breast feeding basics reviewed;Adjust position;Position options;DEBP;Education;Pace feeding  LC provided education on the following;  milk production expectations, hunger cues, day 1/2 wet/dirty diapers, hand expression, cluster feeding, benefits of STS and arousing infant for a feeding.  Lactation informed patient of feeding infant at least 8 or more times w/in a 24hr period but not exceeding 3hrs. Patient verbalized understanding.   Mom has been set up w/ a DEBP.  Mom encouraged to pump after a feeding session at the breast.  Discharge Pump: Personal;Hands Laquitta Dominski (Mom  Cozy) WIC Program: Yes  Consult Status Consult Status: Follow-up Follow-up type: In-patient    Jeremie Abdelaziz S Eliam Snapp 08/23/2023, 11:28 AM

## 2023-08-23 NOTE — Progress Notes (Signed)
 Postpartum Day  1  Subjective: 28 y.o. H5E8887 postpartum day #1 status post vacuum-assisted vaginal delivery. She is ambulating, is tolerating po, is voiding spontaneously.  Her pain is well controlled on PO pain medications. Her lochia is less than menses.  Objective: BP 111/68 (BP Location: Right Arm)   Pulse 69   Temp (!) 97.5 F (36.4 C) (Oral)   Resp 20   Ht 5' 7 (1.702 m)   Wt 85 kg   LMP 12/06/2022   SpO2 100%   BMI 29.35 kg/m    Physical Exam:  General: alert, cooperative, and no distress Breasts: soft/nontender Pulm: nl effort Abdomen: soft, non-tender, active bowel sounds Uterine Fundus: firm Perineum: minimal edema, intact Lochia: appropriate DVT Evaluation: No evidence of DVT seen on physical exam.  Recent Labs    08/22/23 0510 08/23/23 0450  HGB 11.7* 11.0*  HCT 36.1 33.1*  WBC 9.1 13.1*  PLT 223 211    Assessment/Plan: 28 y.o. H5E8887 postpartum day # 1  1. Continue routine postpartum care  2. Infant feeding status: breast feeding and expressed breast milk -Lactation consult PRN for breastfeeding  -Working with feeding team d/t SGA   3. Contraception plan: Nexplanon   4. Postpartum CBC reviewed - clinically significant.  -Hemodynamically stable and asymptomatic -Intervention: no intervention   5. Immunization status:   all immunizations up to date  Disposition: continue inpatient postpartum care , plan for discharge home tomorrow    LOS: 1 day   Therisa CHRISTELLA Pillow, CNM 08/23/2023, 8:32 AM   ----- Therisa Pillow  Certified Nurse Midwife [facility]

## 2023-08-23 NOTE — Anesthesia Postprocedure Evaluation (Signed)
 Anesthesia Post Note  Patient: Jasmine Perkins  Procedure(s) Performed: CESAREAN DELIVERY  Patient location during evaluation: Mother Baby Anesthesia Type: Epidural Level of consciousness: awake and alert Pain management: pain level controlled Vital Signs Assessment: post-procedure vital signs reviewed and stable Respiratory status: spontaneous breathing Cardiovascular status: stable Postop Assessment: patient able to bend at knees, no apparent nausea or vomiting, adequate PO intake and able to ambulate Anesthetic complications: no Comments: Has been able to void.   No notable events documented.   Last Vitals:  Vitals:   08/23/23 0110 08/23/23 0340  BP: 102/66 111/68  Pulse: 68 69  Resp: 18 20  Temp: 36.9 C (!) 36.4 C  SpO2: 99% 100%    Last Pain:  Vitals:   08/23/23 0645  TempSrc:   PainSc: 5                  Chalese Peach Dyane

## 2023-08-24 LAB — SURGICAL PATHOLOGY

## 2023-08-24 MED ORDER — SENNOSIDES-DOCUSATE SODIUM 8.6-50 MG PO TABS: 2.0000 | ORAL_TABLET | Freq: Every day | ORAL | Status: AC

## 2023-08-24 MED ORDER — COCONUT OIL OIL: 1.0000 | TOPICAL_OIL | Status: AC | PRN

## 2023-08-24 MED ORDER — BENZOCAINE-MENTHOL 20-0.5 % EX AERO: 1.0000 | INHALATION_SPRAY | CUTANEOUS | Status: AC | PRN

## 2023-08-24 MED ORDER — WITCH HAZEL-GLYCERIN EX PADS: 1.0000 | MEDICATED_PAD | CUTANEOUS | Status: AC | PRN

## 2023-08-24 MED ORDER — ACETAMINOPHEN 325 MG PO TABS: 650.0000 mg | ORAL_TABLET | ORAL | Status: AC | PRN

## 2023-08-24 MED ORDER — SIMETHICONE 80 MG PO CHEW: 80.0000 mg | CHEWABLE_TABLET | ORAL | Status: AC | PRN

## 2023-08-24 MED ORDER — DIBUCAINE (PERIANAL) 1 % EX OINT
1.0000 | TOPICAL_OINTMENT | CUTANEOUS | Status: AC | PRN
Start: 2023-08-24 — End: ?

## 2023-08-24 MED ORDER — IBUPROFEN 600 MG PO TABS: 600.0000 mg | ORAL_TABLET | Freq: Four times a day (QID) | ORAL | 0 refills | Status: AC

## 2023-08-24 MED ORDER — FERROUS SULFATE 325 (65 FE) MG PO TABS: 325.0000 mg | ORAL_TABLET | Freq: Two times a day (BID) | ORAL | Status: AC

## 2023-08-24 NOTE — Lactation Note (Signed)
 Lactation Consultation Note  Patient Name: Jasmine Perkins Unijb'd Date: 08/24/2023 Age:28 y.o. Reason for consult: 1st time breastfeeding;Exclusive pumping and bottle feeding;Early term 37-38.6wks;Maternal discharge   Maternal Data Has patient been taught Hand Expression?: Yes (MOB able to hand express with visible drops of colostrum present) Does the patient have breastfeeding experience prior to this delivery?: No MOB did not breastfeed previous children, first time breastfeeding and wishes to exclusively breastfeed for as long as possible with minimal interventions.  Feeding Mother's Current Feeding Choice: Breast Milk Following feeding protocol as offering breast every three hours or earlier upon infant cues for no longer than 15 minutes, stimulating and emptying breasts for 15 minutes with DEBP after every feeding attempt to continue encouraging infant success at milk transfer at breast as well as sustain maternal milk supply. MOB reports no pain or discomfort regarding breast surface or tissue, with the exception of occasional pain at initial latch due to narrow infant gape.  LATCH Score Latch: Too sleepy or reluctant, no latch achieved, no sucking elicited.  Audible Swallowing: None  Type of Nipple: Everted at rest and after stimulation  Comfort (Breast/Nipple): Soft / non-tender  Hold (Positioning): No assistance needed to correctly position infant at breast.  LATCH Score: 6  LC latch assessment indicates infant too sleepy to sustain latch for extended period at this time. Reassured MOB about normative infant behavior for this GA group and that preliminary oral examination appears to be positive regarding success of future attempts as baby continues to grow. MOB reports the same behavior is exhibited when utilizing breast shield, infant able to latch on without use of intervention and widen gape by tapping maternal nipple from nose to chin and bringing baby deep onto breast when  mouth is positioned at its widest gape. Transitional milk present in collection bottle, with MOB reporting that she is currently satisfied with feeding plan and hopes to eventually minimize pumping if possible.   Lactation Tools Discussed/Used Tools: Pump;Flanges;8 oz. bottles;Nipple Shields Breast pump type: Double-Electric Breast Pump Pump Education: Setup, frequency, and cleaning;Milk Storage Reason for Pumping: Pumping as per protocol for GA at birth to support breastfeeding per maternal wishes to EBF Pumping frequency: Every 3 hours (Pump following each attempted latch at breast for a max of 15 at breast and 15 with DEBP)  Interventions Interventions: Breast feeding basics reviewed;Assisted with latch;Breast massage;Breast compression;Support pillows;DEBP;Ice  Discharge Discharge Education: Engorgement and breast care;Warning signs for feeding baby;Outpatient recommendation Dicussed signs and symptoms of engorgement and how to manage/avoid progression into mastitis, when to call pediatrician, and encouraged MOB to follow up with outpatient clinic to continue to improve infant efficacy at breast and/or make any adjustments to feeding plan as appropriate.  Consult Status Consult Status: Complete    Donald JONETTA Minerva 08/24/2023, 12:23 PM

## 2023-08-24 NOTE — Plan of Care (Signed)
 Patient discharged, will room in with baby as baby will be discharged tomorrow.  Discharge instructions, when to follow up, and prescriptions reviewed with patient.  Patient verbalized understanding. Elyn Sharps, RN 08/24/23 @1620
